# Patient Record
Sex: Male | Born: 1950
Health system: Southern US, Community
[De-identification: ages and names within clinical notes are randomized; demographics above are authoritative.]

## PROBLEM LIST (undated history)

## (undated) DIAGNOSIS — I10 Essential (primary) hypertension: Secondary | ICD-10-CM

## (undated) DIAGNOSIS — IMO0002 Reserved for concepts with insufficient information to code with codable children: Secondary | ICD-10-CM

## (undated) HISTORY — PX: BLADDER SURGERY: SHX569

---

## 2000-03-10 ENCOUNTER — Encounter: Payer: Self-pay | Admitting: Internal Medicine

## 2000-03-10 ENCOUNTER — Ambulatory Visit (HOSPITAL_COMMUNITY): Admission: RE | Admit: 2000-03-10 | Discharge: 2000-03-10 | Payer: Self-pay | Admitting: Internal Medicine

## 2000-03-17 ENCOUNTER — Encounter: Payer: Self-pay | Admitting: Internal Medicine

## 2000-03-17 ENCOUNTER — Ambulatory Visit (HOSPITAL_COMMUNITY): Admission: RE | Admit: 2000-03-17 | Discharge: 2000-03-17 | Payer: Self-pay | Admitting: Internal Medicine

## 2000-04-08 ENCOUNTER — Encounter: Payer: Self-pay | Admitting: Surgery

## 2000-04-08 ENCOUNTER — Observation Stay (HOSPITAL_COMMUNITY): Admission: RE | Admit: 2000-04-08 | Discharge: 2000-04-09 | Payer: Self-pay | Admitting: Surgery

## 2000-05-12 ENCOUNTER — Encounter: Payer: Self-pay | Admitting: Surgery

## 2000-05-12 ENCOUNTER — Ambulatory Visit (HOSPITAL_COMMUNITY): Admission: RE | Admit: 2000-05-12 | Discharge: 2000-05-12 | Payer: Self-pay | Admitting: Surgery

## 2000-06-22 ENCOUNTER — Ambulatory Visit (HOSPITAL_COMMUNITY): Admission: RE | Admit: 2000-06-22 | Discharge: 2000-06-22 | Payer: Self-pay | Admitting: Surgery

## 2000-06-22 ENCOUNTER — Encounter: Payer: Self-pay | Admitting: Surgery

## 2000-08-23 ENCOUNTER — Ambulatory Visit (HOSPITAL_COMMUNITY): Admission: RE | Admit: 2000-08-23 | Discharge: 2000-08-23 | Payer: Self-pay | Admitting: Surgery

## 2000-08-23 ENCOUNTER — Encounter: Payer: Self-pay | Admitting: Surgery

## 2012-04-22 ENCOUNTER — Encounter (HOSPITAL_COMMUNITY): Payer: Self-pay

## 2012-04-22 ENCOUNTER — Emergency Department (HOSPITAL_COMMUNITY): Payer: BC Managed Care – PPO

## 2012-04-22 ENCOUNTER — Emergency Department (HOSPITAL_COMMUNITY)
Admission: EM | Admit: 2012-04-22 | Discharge: 2012-04-22 | Disposition: A | Discharge status: Home / Self Care | Payer: BC Managed Care – PPO | Attending: Emergency Medicine | Admitting: Emergency Medicine

## 2012-04-22 DIAGNOSIS — K625 Hemorrhage of anus and rectum: Secondary | ICD-10-CM | POA: Insufficient documentation

## 2012-04-22 DIAGNOSIS — Z872 Personal history of diseases of the skin and subcutaneous tissue: Secondary | ICD-10-CM | POA: Insufficient documentation

## 2012-04-22 DIAGNOSIS — I880 Nonspecific mesenteric lymphadenitis: Secondary | ICD-10-CM

## 2012-04-22 DIAGNOSIS — R5383 Other fatigue: Secondary | ICD-10-CM | POA: Insufficient documentation

## 2012-04-22 DIAGNOSIS — R110 Nausea: Secondary | ICD-10-CM | POA: Insufficient documentation

## 2012-04-22 DIAGNOSIS — I10 Essential (primary) hypertension: Secondary | ICD-10-CM | POA: Insufficient documentation

## 2012-04-22 DIAGNOSIS — Z79899 Other long term (current) drug therapy: Secondary | ICD-10-CM | POA: Insufficient documentation

## 2012-04-22 DIAGNOSIS — R5381 Other malaise: Secondary | ICD-10-CM | POA: Insufficient documentation

## 2012-04-22 DIAGNOSIS — K921 Melena: Secondary | ICD-10-CM | POA: Insufficient documentation

## 2012-04-22 DIAGNOSIS — K922 Gastrointestinal hemorrhage, unspecified: Secondary | ICD-10-CM | POA: Insufficient documentation

## 2012-04-22 DIAGNOSIS — M549 Dorsalgia, unspecified: Secondary | ICD-10-CM | POA: Insufficient documentation

## 2012-04-22 DIAGNOSIS — J3489 Other specified disorders of nose and nasal sinuses: Secondary | ICD-10-CM | POA: Insufficient documentation

## 2012-04-22 DIAGNOSIS — R109 Unspecified abdominal pain: Secondary | ICD-10-CM

## 2012-04-22 HISTORY — DX: Reserved for concepts with insufficient information to code with codable children: IMO0002

## 2012-04-22 HISTORY — DX: Essential (primary) hypertension: I10

## 2012-04-22 LAB — CBC WITH DIFFERENTIAL/PLATELET
Eosinophils Absolute: 0.1 10*3/uL (ref 0.0–0.7)
Eosinophils Relative: 3 % (ref 0–5)
HCT: 36.5 % — ABNORMAL LOW (ref 39.0–52.0)
Lymphocytes Relative: 22 % (ref 12–46)
Lymphs Abs: 1.2 10*3/uL (ref 0.7–4.0)
MCH: 27.9 pg (ref 26.0–34.0)
MCV: 80.2 fL (ref 78.0–100.0)
Monocytes Absolute: 0.4 10*3/uL (ref 0.1–1.0)
Monocytes Relative: 8 % (ref 3–12)
Platelets: 154 10*3/uL (ref 150–400)
RBC: 4.55 MIL/uL (ref 4.22–5.81)
WBC: 5.4 10*3/uL (ref 4.0–10.5)

## 2012-04-22 LAB — BASIC METABOLIC PANEL
BUN: 17 mg/dL (ref 6–23)
CO2: 26 mEq/L (ref 19–32)
Calcium: 10.1 mg/dL (ref 8.4–10.5)
Creatinine, Ser: 1.26 mg/dL (ref 0.50–1.35)
GFR calc non Af Amer: 60 mL/min — ABNORMAL LOW (ref >90)
Glucose, Bld: 119 mg/dL — ABNORMAL HIGH (ref 70–99)

## 2012-04-22 LAB — HEPATIC FUNCTION PANEL
ALT: 21 U/L (ref 0–53)
Alkaline Phosphatase: 65 U/L (ref 39–117)
Indirect Bilirubin: 0.5 mg/dL (ref 0.3–0.9)
Total Bilirubin: 0.6 mg/dL (ref 0.3–1.2)
Total Protein: 7.7 g/dL (ref 6.0–8.3)

## 2012-04-22 MED ORDER — ONDANSETRON HCL 4 MG/2ML IJ SOLN
4.0000 mg | Freq: Once | INTRAMUSCULAR | Status: AC
  Administered 2012-04-22: 4 mg via INTRAVENOUS
  Filled 2012-04-22: qty 2

## 2012-04-22 MED ORDER — HYDROCODONE-ACETAMINOPHEN 5-325 MG PO TABS: 1.0000 | ORAL_TABLET | Freq: Four times a day (QID) | ORAL | Status: DC | PRN

## 2012-04-22 MED ORDER — SODIUM CHLORIDE 0.9 % IV SOLN
INTRAVENOUS | Status: DC
  Administered 2012-04-22: 12:00:00 via INTRAVENOUS

## 2012-04-22 MED ORDER — SODIUM CHLORIDE 0.9 % IV SOLN: INTRAVENOUS | Status: DC

## 2012-04-22 MED ORDER — SODIUM CHLORIDE 0.9 % IV BOLUS (SEPSIS)
250.0000 mL | Freq: Once | INTRAVENOUS | Status: AC
  Administered 2012-04-22: 250 mL via INTRAVENOUS

## 2012-04-22 MED ORDER — IOHEXOL 300 MG/ML  SOLN
100.0000 mL | Freq: Once | INTRAMUSCULAR | Status: AC | PRN
  Administered 2012-04-22: 100 mL via INTRAVENOUS

## 2012-04-22 MED ORDER — IOHEXOL 300 MG/ML  SOLN
50.0000 mL | Freq: Once | INTRAMUSCULAR | Status: AC | PRN
  Administered 2012-04-22: 50 mL via ORAL

## 2012-04-22 NOTE — ED Notes (Signed)
Pt reports abd pain this am and saw bright red blood when he wiped after having  A bm this morning.  Also reports nausea and some generalized weakness.

## 2012-04-22 NOTE — ED Provider Notes (Signed)
History    This chart was scribed for Sergio Jakes, MD by [name], ED Scribe. The patient was seen in room APA05/[facility]05. Patient's care was started at 9:59 AM.    CSN: [id number]  Arrival date & time 04/22/12  0930   First MD Initiated Contact with Patient 04/22/12 [phone]      Chief Complaint  Patient presents with  . Abdominal Pain  . GI Bleeding    (Consider location/radiation/quality/duration/timing/severity/associated sxs/prior treatment) The history is provided by the patient. No language interpreter was used.   Sergio Alvarez is a 62 y.o. male with h/o HTN who presents to the Emergency Department complaining of one episode of hematochezia onset this morning. Pt states he was having pain upon having a bowel movement with associated hematochezia. He states that he noticed blood when he wiped but could not clarify if there was blood in the toilet. Pt reports some generalized weakness for about a week. Pt also reports moderate intermittent superpubic abdominal "nagging" pain onset a month ago. Pt states that abdominal pain does not radiate. Abdominal pain lasts for about an hour and then goes away. Pt has had some associated nausea, headaches and back pain as well. He states he took some anti-acid with no immediate relief. An upper endoscopy was done on pt a month ago because of sx's by Dr. Teena Dunk. Procedure found that he had evidence of a stomach ulcer. Pt denies fever, chills, cough, vomiting, diarrhea, SOB, and any other associated symptoms. Dr. Olena Leatherwood is PCP.  Past Medical History  Diagnosis Date  . Hypertension   . Ulcer     Past Surgical History  Procedure Laterality Date  . Bladder surgery      No family history on file.  History  Substance Use Topics  . Smoking status: Never Smoker   . Smokeless tobacco: Not on file  . Alcohol Use: No      Review of Systems  Constitutional: Positive for fatigue. Negative for fever and chills.  HENT: Positive for  congestion.   Eyes: Negative for visual disturbance.  Respiratory: Negative for cough and shortness of breath.   Cardiovascular: Negative for chest pain.  Gastrointestinal: Positive for abdominal pain and blood in stool. Negative for nausea, vomiting and diarrhea.  Genitourinary: Negative for dysuria and hematuria.  Musculoskeletal: Positive for back pain. Negative for joint swelling.  Skin: Negative for rash.  Neurological: Positive for headaches.  Hematological: Does not bruise/bleed easily.  Psychiatric/Behavioral: Negative for confusion.    Allergies  Review of patient's allergies indicates no known allergies.  Home Medications   Current Outpatient Rx  Name  Route  Sig  Dispense  Refill  . amlodipine-olmesartan (AZOR) 10-20 MG per tablet   Oral   Take 1 tablet by mouth daily.         . chlorthalidone (HYGROTON) 25 MG tablet   Oral   Take 25 mg by mouth daily.         . Cholecalciferol (VITAMIN D) 2000 UNITS CAPS   Oral   Take 1 capsule by mouth daily.         . fish oil-omega-3 fatty acids 1000 MG capsule   Oral   Take 1 g by mouth daily.         Marland Kitchen omeprazole (PRILOSEC) 40 MG capsule   Oral   Take 40 mg by mouth daily.         Marland Kitchen HYDROcodone-acetaminophen (NORCO/VICODIN) 5-325 MG per tablet   Oral   Take 1-2  tablets by mouth every 6 (six) hours as needed for pain.   10 tablet   0     BP 161/71  Pulse 93  Temp(Src) 98.7 F (37.1 C) (Oral)  Resp 18  Ht 6\' 1"  (1.854 m)  Wt 217 lb (98.431 kg)  BMI 28.64 kg/m2  SpO2 95%  Physical Exam  Nursing note and vitals reviewed. Constitutional: He is oriented to person, place, and time. He appears well-developed and well-nourished. No distress.  HENT:  Head: Normocephalic and atraumatic.  Right Ear: External ear normal.  Left Ear: External ear normal.  Mouth/Throat: Oropharynx is clear and moist. No oropharyngeal exudate.  Eyes: Conjunctivae and EOM are normal. Pupils are equal, round, and reactive to  light. No scleral icterus.  Neck: Neck supple. No tracheal deviation present.  Cardiovascular: Normal rate, regular rhythm and normal heart sounds.   No murmur heard. Pulmonary/Chest: Effort normal and breath sounds normal. No respiratory distress. He has no rales.  Abdominal: Soft. Bowel sounds are normal. There is no tenderness. There is no guarding.  No mass above the umbilicus   Musculoskeletal: Normal range of motion.  Lymphadenopathy:    He has no cervical adenopathy.  Neurological: He is alert and oriented to person, place, and time. No cranial nerve deficit. He exhibits normal muscle tone. Coordination normal.  Skin: Skin is warm and dry.  Psychiatric: He has a normal mood and affect. His behavior is normal.    ED Course  Procedures (including critical care time) DIAGNOSTIC STUDIES: Oxygen Saturation is 95% on room air, adequate by my interpretation.    COORDINATION OF CARE: 10:52 AM Discussed ED treatment with pt and pt agrees.     Labs Reviewed  CBC WITH DIFFERENTIAL - Abnormal; Notable for the following:    Hemoglobin 12.7 (*)    HCT 36.5 (*)    All other components within normal limits  BASIC METABOLIC PANEL - Abnormal; Notable for the following:    Glucose, Bld 119 (*)    GFR calc non Af Amer 60 (*)    GFR calc Af Amer 69 (*)    All other components within normal limits  LIPASE, BLOOD  HEPATIC FUNCTION PANEL   Ct Abdomen Pelvis W Contrast  04/22/2012  *RADIOLOGY REPORT*  Clinical Data: GI bleed, abdominal pain.  CT ABDOMEN AND PELVIS WITH CONTRAST  Technique:  Multidetector CT imaging of the abdomen and pelvis was performed following the standard protocol during bolus administration of intravenous contrast.  Contrast: 50mL OMNIPAQUE IOHEXOL 300 MG/ML  SOLN, OMNIPAQUE IOHEXOL 300 MG/ML  SOLN  Comparison: 08/23/2000 by report only  Findings: Visualized lung bases clear.  Vascular clips in the gallbladder fossa.  Unremarkable liver, spleen, adrenal glands,  pancreas.  2.5 cm fluid attenuation lesion in the lower pole right kidney probably cyst.  No hydronephrosis.  There are hazy infiltrative/inflammatory changes centrally in the mesentery, with a fairly well-defined peripheral margin.  There are regional enlarged central mesenteric lymph nodes up to 10 mm diameter.  Similar changes were described on the previous study. No pelvic or retroperitoneal adenopathy.  No ascites.  No free air. Stomach physiologically distended.  Small bowel and colon nondilated.  Urinary bladder incompletely distended, mildly thick- walled.  Mild prostatic enlargement.  Lumbar spine intact.  IMPRESSION:  1. No acute abdominal process. 2.   Central mesenteric   changes and adenopathy as described previously suggesting chronic mesenteric adenitis.   Original Report Authenticated By: D. Andria Rhein, MD  Date: 04/22/2012  Rate: 93  Rhythm: normal sinus rhythm  QRS Axis: left  Intervals: normal  ST/T Wave abnormalities: normal  Conduction Disutrbances:none  Narrative Interpretation:   Old EKG Reviewed: unchanged EKG without significant changes compared to 04/08/2000  Results for orders placed during the hospital encounter of 04/22/12  CBC WITH DIFFERENTIAL      Result Value Range   WBC 5.4  4.0 - 10.5 K/uL   RBC 4.55  4.22 - 5.81 MIL/uL   Hemoglobin 12.7 (*) 13.0 - 17.0 g/dL   HCT 62.9 (*) 52.8 - 41.3 %   MCV 80.2  78.0 - 100.0 fL   MCH 27.9  26.0 - 34.0 pg   MCHC 34.8  30.0 - 36.0 g/dL   RDW 24.4  01.0 - 27.2 %   Platelets 154  150 - 400 K/uL   Neutrophils Relative 67  43 - 77 %   Neutro Abs 3.6  1.7 - 7.7 K/uL   Lymphocytes Relative 22  12 - 46 %   Lymphs Abs 1.2  0.7 - 4.0 K/uL   Monocytes Relative 8  3 - 12 %   Monocytes Absolute 0.4  0.1 - 1.0 K/uL   Eosinophils Relative 3  0 - 5 %   Eosinophils Absolute 0.1  0.0 - 0.7 K/uL   Basophils Relative 1  0 - 1 %   Basophils Absolute 0.0  0.0 - 0.1 K/uL  BASIC METABOLIC PANEL      Result Value Range   Sodium  138  135 - 145 mEq/L   Potassium 4.0  3.5 - 5.1 mEq/L   Chloride 101  96 - 112 mEq/L   CO2 26  19 - 32 mEq/L   Glucose, Bld 119 (*) 70 - 99 mg/dL   BUN 17  6 - 23 mg/dL   Creatinine, Ser 5.36  0.50 - 1.35 mg/dL   Calcium 64.4  8.4 - 03.4 mg/dL   GFR calc non Af Amer 60 (*) >90 mL/min   GFR calc Af Amer 69 (*) >90 mL/min  LIPASE, BLOOD      Result Value Range   Lipase 26  11 - 59 U/L  HEPATIC FUNCTION PANEL      Result Value Range   Total Protein 7.7  6.0 - 8.3 g/dL   Albumin 4.3  3.5 - 5.2 g/dL   AST 23  0 - 37 U/L   ALT 21  0 - 53 U/L   Alkaline Phosphatase 65  39 - 117 U/L   Total Bilirubin 0.6  0.3 - 1.2 mg/dL   Bilirubin, Direct 0.1  0.0 - 0.3 mg/dL   Indirect Bilirubin 0.5  0.3 - 0.9 mg/dL     1. Abdominal pain   2. Mesenteric adenitis   3. Rectal bleeding       MDM  CT scan consistent with chronic mesenteric adenitis is been there before it may explain abdominal pain. Patient without any evidence for the rectal bleeding. Either based on labs or exam hemoglobin and hematocrit is normal. No significant leukocytosis. Platelet count is normal. Recommended patient have a colonoscopy through his GI doctor that did the upper endoscopy to follow back up with his primary care Dr. Patient will need to return for any further rectal bleeding. Patient had one episode of rectal bleeding today which was just blood on the tall paper. Hemodynamically stable no evidence of significant blood loss. No acute abdominal process.       I personally performed the  services described in this documentation, which was scribed in my presence. The recorded information has been reviewed and is accurate.          Sergio Jakes, MD 04/22/12 1239

## 2016-04-27 DIAGNOSIS — Z87891 Personal history of nicotine dependence: Secondary | ICD-10-CM | POA: Diagnosis not present

## 2016-04-27 DIAGNOSIS — J40 Bronchitis, not specified as acute or chronic: Secondary | ICD-10-CM | POA: Diagnosis not present

## 2016-04-27 DIAGNOSIS — I10 Essential (primary) hypertension: Secondary | ICD-10-CM | POA: Diagnosis not present

## 2016-05-03 DIAGNOSIS — Z6832 Body mass index (BMI) 32.0-32.9, adult: Secondary | ICD-10-CM | POA: Diagnosis not present

## 2016-05-03 DIAGNOSIS — K920 Hematemesis: Secondary | ICD-10-CM | POA: Diagnosis not present

## 2016-05-03 DIAGNOSIS — E784 Other hyperlipidemia: Secondary | ICD-10-CM | POA: Diagnosis not present

## 2016-05-03 DIAGNOSIS — M545 Low back pain: Secondary | ICD-10-CM | POA: Diagnosis not present

## 2016-05-03 DIAGNOSIS — I10 Essential (primary) hypertension: Secondary | ICD-10-CM | POA: Diagnosis not present

## 2016-07-25 ENCOUNTER — Emergency Department (HOSPITAL_COMMUNITY)
Admission: EM | Admit: 2016-07-25 | Discharge: 2016-07-26 | Disposition: A | Discharge status: Home / Self Care | Payer: Medicare Other | Attending: Emergency Medicine | Admitting: Emergency Medicine

## 2016-07-25 ENCOUNTER — Encounter (HOSPITAL_COMMUNITY): Payer: Self-pay | Admitting: *Deleted

## 2016-07-25 DIAGNOSIS — M62838 Other muscle spasm: Secondary | ICD-10-CM | POA: Diagnosis not present

## 2016-07-25 DIAGNOSIS — M791 Myalgia, unspecified site: Secondary | ICD-10-CM

## 2016-07-25 DIAGNOSIS — R0602 Shortness of breath: Secondary | ICD-10-CM | POA: Insufficient documentation

## 2016-07-25 DIAGNOSIS — Z79899 Other long term (current) drug therapy: Secondary | ICD-10-CM | POA: Diagnosis not present

## 2016-07-25 DIAGNOSIS — R252 Cramp and spasm: Secondary | ICD-10-CM | POA: Diagnosis not present

## 2016-07-25 DIAGNOSIS — R0600 Dyspnea, unspecified: Secondary | ICD-10-CM | POA: Diagnosis not present

## 2016-07-25 DIAGNOSIS — I10 Essential (primary) hypertension: Secondary | ICD-10-CM | POA: Insufficient documentation

## 2016-07-25 DIAGNOSIS — N281 Cyst of kidney, acquired: Secondary | ICD-10-CM | POA: Insufficient documentation

## 2016-07-25 NOTE — ED Triage Notes (Signed)
Pt c/o cramping to both arms and slight headache; pt also states he has had some intermittent sob

## 2016-07-26 ENCOUNTER — Emergency Department (HOSPITAL_COMMUNITY): Payer: Medicare Other

## 2016-07-26 DIAGNOSIS — R0602 Shortness of breath: Secondary | ICD-10-CM | POA: Diagnosis not present

## 2016-07-26 DIAGNOSIS — R0600 Dyspnea, unspecified: Secondary | ICD-10-CM | POA: Diagnosis not present

## 2016-07-26 LAB — CBC WITH DIFFERENTIAL/PLATELET
BASOS ABS: 0 10*3/uL (ref 0.0–0.1)
BASOS PCT: 1 %
Eosinophils Absolute: 0.1 10*3/uL (ref 0.0–0.7)
Eosinophils Relative: 2 %
HEMATOCRIT: 36.3 % — AB (ref 39.0–52.0)
HEMOGLOBIN: 12.5 g/dL — AB (ref 13.0–17.0)
Lymphocytes Relative: 22 %
Lymphs Abs: 1.4 10*3/uL (ref 0.7–4.0)
MCH: 27 pg (ref 26.0–34.0)
MCHC: 34.4 g/dL (ref 30.0–36.0)
MCV: 78.4 fL (ref 78.0–100.0)
MONOS PCT: 10 %
Monocytes Absolute: 0.6 10*3/uL (ref 0.1–1.0)
NEUTROS ABS: 4.3 10*3/uL (ref 1.7–7.7)
NEUTROS PCT: 65 %
Platelets: 183 10*3/uL (ref 150–400)
RBC: 4.63 MIL/uL (ref 4.22–5.81)
RDW: 14.5 % (ref 11.5–15.5)
WBC: 6.4 10*3/uL (ref 4.0–10.5)

## 2016-07-26 LAB — COMPREHENSIVE METABOLIC PANEL
ALBUMIN: 4.4 g/dL (ref 3.5–5.0)
ALK PHOS: 63 U/L (ref 38–126)
ALT: 24 U/L (ref 17–63)
ANION GAP: 12 (ref 5–15)
AST: 21 U/L (ref 15–41)
BILIRUBIN TOTAL: 0.5 mg/dL (ref 0.3–1.2)
BUN: 23 mg/dL — AB (ref 6–20)
CALCIUM: 9.9 mg/dL (ref 8.9–10.3)
CO2: 28 mmol/L (ref 22–32)
Chloride: 101 mmol/L (ref 101–111)
Creatinine, Ser: 1.44 mg/dL — ABNORMAL HIGH (ref 0.61–1.24)
GFR calc Af Amer: 57 mL/min — ABNORMAL LOW (ref >60)
GFR calc non Af Amer: 50 mL/min — ABNORMAL LOW (ref >60)
GLUCOSE: 138 mg/dL — AB (ref 65–99)
POTASSIUM: 3.1 mmol/L — AB (ref 3.5–5.1)
SODIUM: 141 mmol/L (ref 135–145)
Total Protein: 7.8 g/dL (ref 6.5–8.1)

## 2016-07-26 LAB — TROPONIN I: Troponin I: <0.03 ng/mL (ref <0.03)

## 2016-07-26 LAB — CK: Total CK: 183 U/L (ref 49–397)

## 2016-07-26 MED ORDER — LORAZEPAM 2 MG/ML IJ SOLN
0.5000 mg | Freq: Once | INTRAMUSCULAR | Status: AC
  Administered 2016-07-26: 0.5 mg via INTRAVENOUS

## 2016-07-26 MED ORDER — IOPAMIDOL (ISOVUE-370) INJECTION 76%
100.0000 mL | Freq: Once | INTRAVENOUS | Status: AC | PRN
  Administered 2016-07-26: 100 mL via INTRAVENOUS

## 2016-07-26 MED ORDER — SODIUM CHLORIDE 0.9 % IV BOLUS (SEPSIS)
500.0000 mL | Freq: Once | INTRAVENOUS | Status: AC
  Administered 2016-07-26: 500 mL via INTRAVENOUS

## 2016-07-26 MED ORDER — METHOCARBAMOL 500 MG PO TABS: 500.0000 mg | ORAL_TABLET | Freq: Three times a day (TID) | ORAL | 0 refills | Status: DC | PRN

## 2016-07-26 MED ORDER — LORAZEPAM 2 MG/ML IJ SOLN
INTRAMUSCULAR | Status: AC
  Filled 2016-07-26: qty 1

## 2016-07-26 NOTE — ED Notes (Signed)
Pt sleeping at this time.  Wife at bedside.  

## 2016-07-26 NOTE — ED Provider Notes (Signed)
[facility] DEPT Provider Note   CSN: [id number] Arrival date & time: 07/25/16  2322     History   Chief Complaint Chief Complaint  Patient presents with  . Generalized Body Aches    HPI Sergio Alvarez is a 66 y.o. male.  Patient presents with complaints of pain in his shoulders and arms. This has been ongoing for 2 days. He reports that he has been having an aching pain that will come in one arm or the other in the last 4 and then fade away. He has not identified any causal factors. He has not taken any medication for this. It has been on both sides, but randomly will occur. He has had some associated lower back pain. He also reports a mild headache.      Past Medical History:  Diagnosis Date  . Hypertension   . Ulcer     There are no active problems to display for this patient.   Past Surgical History:  Procedure Laterality Date  . BLADDER SURGERY         Home Medications    Prior to Admission medications   Medication Sig Start Date End Date Taking? Authorizing Provider  amlodipine-olmesartan (AZOR) 10-20 MG per tablet Take 1 tablet by mouth daily.    [provider]  chlorthalidone (HYGROTON) 25 MG tablet Take 25 mg by mouth daily.    [provider]  Cholecalciferol (VITAMIN D) 2000 UNITS CAPS Take 1 capsule by mouth daily.    [provider]  fish oil-omega-3 fatty acids 1000 MG capsule Take 1 g by mouth daily.    [provider]    Family History History reviewed. No pertinent family history.  Social History Social History  Substance Use Topics  . Smoking status: Never Smoker  . Smokeless tobacco: Never Used  . Alcohol use No     Allergies   Patient has no known allergies.   Review of Systems Review of Systems  Cardiovascular: Negative for chest pain.  Musculoskeletal: Positive for arthralgias and myalgias.  Skin: Negative for rash.  Neurological: Positive for headaches.  All other systems reviewed  and are negative.    Physical Exam Updated Vital Signs BP (!) 169/80 (BP Location: Left Arm)   Pulse 98   Temp 98.9 F (37.2 C) (Oral)   Resp 20   Ht 6' (1.829 m)   Wt 106.6 kg (235 lb)   SpO2 98%   BMI 31.87 kg/m   Physical Exam  Constitutional: He is oriented to person, place, and time. He appears well-developed and well-nourished. No distress.  HENT:  Head: Normocephalic and atraumatic.  Right Ear: Hearing normal.  Left Ear: Hearing normal.  Nose: Nose normal.  Mouth/Throat: Oropharynx is clear and moist and mucous membranes are normal.  Eyes: Conjunctivae and EOM are normal. Pupils are equal, round, and reactive to light.  Neck: Normal range of motion. Neck supple.  Cardiovascular: Regular rhythm, S1 normal and S2 normal.  Exam reveals no gallop and no friction rub.   No murmur heard. Pulmonary/Chest: Effort normal and breath sounds normal. No respiratory distress. He exhibits no tenderness.  Abdominal: Soft. Normal appearance and bowel sounds are normal. There is no hepatosplenomegaly. There is no tenderness. There is no rebound, no guarding, no tenderness at McBurney's point and negative Murphy's sign. No hernia.  Musculoskeletal: Normal range of motion.  Neurological: He is alert and oriented to person, place, and time. He has normal strength. No cranial nerve deficit or sensory deficit.  Coordination normal. GCS eye subscore is 4. GCS verbal subscore is 5. GCS motor subscore is 6.  Skin: Skin is warm, dry and intact. No rash noted. No cyanosis.  Psychiatric: He has a normal mood and affect. His speech is normal and behavior is normal. Thought content normal.  Nursing note and vitals reviewed.    ED Treatments / Results  Labs (all labs ordered are listed, but only abnormal results are displayed) Labs Reviewed  CBC WITH DIFFERENTIAL/PLATELET - Abnormal; Notable for the following:       Result Value   Hemoglobin 12.5 (*)    HCT 36.3 (*)    All other components  within normal limits  COMPREHENSIVE METABOLIC PANEL - Abnormal; Notable for the following:    Potassium 3.1 (*)    Glucose, Bld 138 (*)    BUN 23 (*)    Creatinine, Ser 1.44 (*)    GFR calc non Af Amer 50 (*)    GFR calc Af Amer 57 (*)    All other components within normal limits  TROPONIN I  TROPONIN I  CK    EKG  EKG Interpretation  Date/Time:  Sunday July 25 2016 23:48:00 EDT Ventricular Rate:  96 PR Interval:  170 QRS Duration: 90 QT Interval:  350 QTC Calculation: 442 R Axis:   -46 Text Interpretation:  Normal sinus rhythm Left anterior fascicular block Cannot rule out Inferior infarct (masked by fascicular block?) , age undetermined Abnormal ECG No significant change since last tracing Confirmed by Orpah Greek [phone]) on 07/26/2016 1:00:41 AM       Radiology Dg Chest 2 View  Result Date: 07/26/2016 CLINICAL DATA:  Shortness of breath. Arm cramping. History of hypertension. EXAM: CHEST  2 VIEW COMPARISON:  Chest radiograph April 27, 2016 FINDINGS: Cardiomediastinal silhouette is normal. No pleural effusions or focal consolidations. Azygos fissure . Trachea projects midline and there is no pneumothorax. Soft tissue planes and included osseous structures are non-suspicious. Surgical clips in the included right abdomen compatible with cholecystectomy. IMPRESSION: Stable examination:  No acute cardiopulmonary process. Electronically Signed   By: Elon Alas M.D.   On: 07/26/2016 01:40   Ct Angio Chest/abd/pel For Dissection W And/or Wo Contrast  Result Date: 07/26/2016 CLINICAL DATA:  Upper extremity cramping and intermittent dyspnea today. EXAM: CT ANGIOGRAPHY CHEST, ABDOMEN AND PELVIS TECHNIQUE: Multidetector CT imaging through the chest, abdomen and pelvis was performed using the standard protocol during bolus administration of intravenous contrast. Multiplanar reconstructed images and MIPs were obtained and reviewed to evaluate the vascular anatomy. CONTRAST:   100 mL Isovue 370 intravenous COMPARISON:  04/22/2012 FINDINGS: CTA CHEST FINDINGS Cardiovascular: Preferential opacification of the thoracic aorta. No evidence of thoracic aortic aneurysm or dissection. Normal heart size. No pericardial effusion. Mediastinum/Nodes: No enlarged mediastinal, hilar, or axillary lymph nodes. Thyroid gland, trachea, and esophagus demonstrate no significant findings. Lungs/Pleura: Lungs are clear. No pleural effusion or pneumothorax. Musculoskeletal: Remote healed sternal fracture. No significant skeletal lesion. Review of the MIP images confirms the above findings. CTA ABDOMEN AND PELVIS FINDINGS VASCULAR Aorta: Normal caliber aorta. Moderate atherosclerotic calcification in the distal aorta. Celiac: Patent without evidence of aneurysm, dissection, vasculitis or significant stenosis. SMA: Patent without evidence of aneurysm, dissection, vasculitis or significant stenosis. Renals: Both renal arteries are patent without evidence of aneurysm, dissection, vasculitis, fibromuscular dysplasia or significant stenosis. IMA: Patent without evidence of aneurysm, dissection, vasculitis or significant stenosis. Inflow: Patent without evidence of aneurysm, dissection, vasculitis or significant stenosis. Veins: No obvious venous  abnormality within the limitations of this arterial phase study. Review of the MIP images confirms the above findings. NON-VASCULAR Hepatobiliary: Cholecystectomy. Mild hepatic steatosis. No focal liver lesion. Normal bile ducts. Pancreas: Unremarkable. No pancreatic ductal dilatation or surrounding inflammatory changes. Spleen: Normal in size without focal abnormality. Adrenals/Urinary Tract: Both adrenals are normal. 3.2 cm cyst of the lateral right lower pole. 1.2 cm low-attenuation lesion at the posterior right lower pole, probably also a cyst. Both are slightly larger compared to 04/22/2012. No suspicious focal renal lesion. Collecting systems, ureters and urinary  bladder are unremarkable. Stomach/Bowel: Stomach is within normal limits. Appendix is normal. No evidence of bowel wall thickening, distention, or inflammatory changes. Lymphatic: Hazy opacities in the central mesentery with mildly prominent central mesenteric lymph nodes. This is unchanged from 2014. Reproductive: Unremarkable. Other: No focal inflammation.  No ascites. Musculoskeletal: No significant skeletal lesion. Lower lumbar facet arthritis. Review of the MIP images confirms the above findings. IMPRESSION: 1. Aortic atherosclerosis.  No aneurysm, stenosis or occlusion. 2. No acute findings are evident in the abdomen or pelvis. 3. Stable mesenteric nodes without change from 04/22/2012. 4. Mild hepatic steatosis. 5. Lower pole right renal cysts, slightly enlarged from 2014. No suspicious renal lesions. Electronically Signed   By: Andreas Newport M.D.   On: 07/26/2016 03:24    Procedures Procedures (including critical care time)  Medications Ordered in ED Medications  sodium chloride 0.9 % bolus 500 mL (0 mLs Intravenous Stopped 07/26/16 0255)  iopamidol (ISOVUE-370) 76 % injection 100 mL (100 mLs Intravenous Contrast Given 07/26/16 0242)  LORazepam (ATIVAN) injection 0.5 mg (0.5 mg Intravenous Given 07/26/16 0218)     Initial Impression / Assessment and Plan / ED Course  I have reviewed the triage vital signs and the nursing notes.  Pertinent labs & imaging results that were available during my care of the patient were reviewed by me and considered in my medical decision making (see chart for details).     Patient presents to the emergency department with complaints of aches and pains. He was experiencing intermittent episodes of aching pain in both of his shoulders and back. He also had some lower back pain. Symptoms were very nonspecific. He is not experiencing any anterior chest pain. EKG at arrival was normal. He had a normal troponin. This was repeated and was still undetectable. No  concern for acute cardiac syndrome or cardiac etiology of the bilateral shoulder pain. Because he was having pain in the shoulder area as well as back, aortic abnormalities were considered. CT angiography of chest, abdomen and pelvis showed no pathology.  While here in the ER he started to have some intermittent episodes of feeling weak and experiencing hot flushes. While this was occurring he was having fasciculations and spasms of the muscles in his legs. This is likely was causing the pains in his upper arms as well. Patient treated with some IV hydration. He has not had a fever. A CPK was checked, no evidence of rhabdomyolysis. Kidney function is GFR 57 with a creatinine of 1.44.  At this point, patient's symptoms appear to be secondary to spasms of proximal muscles. I do not feel the patient requires any further workup at this time. He will be encouraged to continue oral hydration, given muscle relaxers and is to follow-up with his primary care doctor.  Final Clinical Impressions(s) / ED Diagnoses   Final diagnoses:  Myalgia  Muscle spasm    New Prescriptions New Prescriptions   No medications on file  Orpah Greek, MD 07/26/16 [phone]

## 2016-07-26 NOTE — ED Notes (Signed)
Pt reports having episodes of "feeling hot"

## 2016-07-29 ENCOUNTER — Emergency Department (HOSPITAL_COMMUNITY)
Admission: EM | Admit: 2016-07-29 | Discharge: 2016-07-30 | Disposition: A | Discharge status: Home / Self Care | Payer: Medicare Other | Attending: Emergency Medicine | Admitting: Emergency Medicine

## 2016-07-29 ENCOUNTER — Encounter (HOSPITAL_COMMUNITY): Payer: Self-pay

## 2016-07-29 DIAGNOSIS — K921 Melena: Secondary | ICD-10-CM | POA: Insufficient documentation

## 2016-07-29 DIAGNOSIS — R531 Weakness: Secondary | ICD-10-CM | POA: Diagnosis not present

## 2016-07-29 DIAGNOSIS — Z87891 Personal history of nicotine dependence: Secondary | ICD-10-CM | POA: Diagnosis not present

## 2016-07-29 DIAGNOSIS — I10 Essential (primary) hypertension: Secondary | ICD-10-CM | POA: Insufficient documentation

## 2016-07-29 DIAGNOSIS — R232 Flushing: Secondary | ICD-10-CM

## 2016-07-29 DIAGNOSIS — R110 Nausea: Secondary | ICD-10-CM | POA: Insufficient documentation

## 2016-07-29 DIAGNOSIS — R1033 Periumbilical pain: Secondary | ICD-10-CM | POA: Insufficient documentation

## 2016-07-29 DIAGNOSIS — R5383 Other fatigue: Secondary | ICD-10-CM | POA: Diagnosis not present

## 2016-07-29 DIAGNOSIS — R55 Syncope and collapse: Secondary | ICD-10-CM | POA: Diagnosis not present

## 2016-07-29 LAB — CBC
HCT: 38 % — ABNORMAL LOW (ref 39.0–52.0)
HEMOGLOBIN: 13.4 g/dL (ref 13.0–17.0)
MCH: 27.7 pg (ref 26.0–34.0)
MCHC: 35.3 g/dL (ref 30.0–36.0)
MCV: 78.5 fL (ref 78.0–100.0)
Platelets: 155 10*3/uL (ref 150–400)
RBC: 4.84 MIL/uL (ref 4.22–5.81)
RDW: 14.5 % (ref 11.5–15.5)
WBC: 7.2 10*3/uL (ref 4.0–10.5)

## 2016-07-29 LAB — BASIC METABOLIC PANEL
ANION GAP: 14 (ref 5–15)
BUN: 24 mg/dL — ABNORMAL HIGH (ref 6–20)
CHLORIDE: 97 mmol/L — AB (ref 101–111)
CO2: 24 mmol/L (ref 22–32)
Calcium: 9.9 mg/dL (ref 8.9–10.3)
Creatinine, Ser: 1.51 mg/dL — ABNORMAL HIGH (ref 0.61–1.24)
GFR calc non Af Amer: 47 mL/min — ABNORMAL LOW (ref >60)
GFR, EST AFRICAN AMERICAN: 54 mL/min — AB (ref >60)
Glucose, Bld: 116 mg/dL — ABNORMAL HIGH (ref 65–99)
Potassium: 3 mmol/L — ABNORMAL LOW (ref 3.5–5.1)
Sodium: 135 mmol/L (ref 135–145)

## 2016-07-29 NOTE — ED Triage Notes (Addendum)
Patient states that he is feeling weak, having hot spells at times.  Feels like he is going to pass out at times.  May have some blood in his stool. Patient ambulated to triage office from waiting room without any difficulties.  States that his legs feel weak.

## 2016-07-30 ENCOUNTER — Emergency Department (HOSPITAL_COMMUNITY): Payer: Medicare Other

## 2016-07-30 DIAGNOSIS — G603 Idiopathic progressive neuropathy: Secondary | ICD-10-CM | POA: Diagnosis not present

## 2016-07-30 DIAGNOSIS — I10 Essential (primary) hypertension: Secondary | ICD-10-CM | POA: Diagnosis not present

## 2016-07-30 DIAGNOSIS — R1033 Periumbilical pain: Secondary | ICD-10-CM | POA: Diagnosis not present

## 2016-07-30 DIAGNOSIS — E784 Other hyperlipidemia: Secondary | ICD-10-CM | POA: Diagnosis not present

## 2016-07-30 DIAGNOSIS — M545 Low back pain: Secondary | ICD-10-CM | POA: Diagnosis not present

## 2016-07-30 LAB — URINALYSIS, ROUTINE W REFLEX MICROSCOPIC
Bilirubin Urine: NEGATIVE
GLUCOSE, UA: NEGATIVE mg/dL
HGB URINE DIPSTICK: NEGATIVE
KETONES UR: NEGATIVE mg/dL
Leukocytes, UA: NEGATIVE
Nitrite: NEGATIVE
PH: 5 (ref 5.0–8.0)
Protein, ur: NEGATIVE mg/dL
Specific Gravity, Urine: 1.012 (ref 1.005–1.030)

## 2016-07-30 LAB — TROPONIN I
Troponin I: <0.03 ng/mL (ref <0.03)
Troponin I: <0.03 ng/mL (ref <0.03)

## 2016-07-30 LAB — HEPATIC FUNCTION PANEL
ALBUMIN: 4.4 g/dL (ref 3.5–5.0)
ALK PHOS: 61 U/L (ref 38–126)
ALT: 27 U/L (ref 17–63)
AST: 22 U/L (ref 15–41)
BILIRUBIN DIRECT: 0.2 mg/dL (ref 0.1–0.5)
BILIRUBIN TOTAL: 1 mg/dL (ref 0.3–1.2)
Indirect Bilirubin: 0.8 mg/dL (ref 0.3–0.9)
Total Protein: 7.5 g/dL (ref 6.5–8.1)

## 2016-07-30 LAB — LIPASE, BLOOD: LIPASE: 23 U/L (ref 11–51)

## 2016-07-30 LAB — CK: CK TOTAL: 230 U/L (ref 49–397)

## 2016-07-30 MED ORDER — ONDANSETRON HCL 4 MG/2ML IJ SOLN
4.0000 mg | Freq: Once | INTRAMUSCULAR | Status: AC
  Administered 2016-07-30: 4 mg via INTRAVENOUS
  Filled 2016-07-30: qty 2

## 2016-07-30 MED ORDER — IOPAMIDOL (ISOVUE-300) INJECTION 61%
100.0000 mL | Freq: Once | INTRAVENOUS | Status: AC | PRN
  Administered 2016-07-30: 100 mL via INTRAVENOUS

## 2016-07-30 MED ORDER — SODIUM CHLORIDE 0.9 % IV BOLUS (SEPSIS)
1000.0000 mL | Freq: Once | INTRAVENOUS | Status: AC
  Administered 2016-07-30: 1000 mL via INTRAVENOUS

## 2016-07-30 MED ORDER — POTASSIUM CHLORIDE CRYS ER 20 MEQ PO TBCR
40.0000 meq | EXTENDED_RELEASE_TABLET | Freq: Once | ORAL | Status: AC
  Administered 2016-07-30: 40 meq via ORAL
  Filled 2016-07-30: qty 2

## 2016-07-30 NOTE — ED Notes (Signed)
Occult Blood: Negative

## 2016-07-30 NOTE — ED Provider Notes (Signed)
[facility] DEPT Provider Note   CSN: [id number] Arrival date & time: 07/29/16  2205     History   Chief Complaint Chief Complaint  Patient presents with  . Weakness    HPI Sergio Alvarez is a 66 y.o. male.  Patient presenting with chills, generalized weakness, hot flashes and feeling like he is going to pass out that comes and goes. Reports started feeling bad this evening and feels sensation of warmth spreading over his body and feeling like he might pass out. He has some lower abdominal pain and thinks she saw some blood in his stool earlier today but isn't sure. No vomiting. No fever. No chest pain or shortness of breath. No focal weakness, numbness or tingling. No difficulty speaking or swallowing. No sick contacts or recent travel. Did eat dinner normally and started feeling poorly shortly after. Does not take any blood thinners. Denies any alcohol or drug use.   The history is provided by the patient.  Weakness  Primary symptoms include dizziness. Pertinent negatives include no shortness of breath and no vomiting.    Past Medical History:  Diagnosis Date  . Hypertension   . Ulcer     There are no active problems to display for this patient.   Past Surgical History:  Procedure Laterality Date  . BLADDER SURGERY         Home Medications    Prior to Admission medications   Medication Sig Start Date End Date Taking? Authorizing Provider  amlodipine-olmesartan (AZOR) 10-20 MG per tablet Take 1 tablet by mouth daily.    [provider]  chlorthalidone (HYGROTON) 25 MG tablet Take 25 mg by mouth daily.    [provider]  Cholecalciferol (VITAMIN D) 2000 UNITS CAPS Take 1 capsule by mouth daily.    [provider]  fish oil-omega-3 fatty acids 1000 MG capsule Take 1 g by mouth daily.    [provider]  methocarbamol (ROBAXIN) 500 MG tablet Take 1 tablet (500 mg total) by mouth every 8 (eight) hours as needed for muscle spasms.  07/26/16   Orpah Greek, MD    Family History History reviewed. No pertinent family history.  Social History Social History  Substance Use Topics  . Smoking status: Former Research scientist (life sciences)  . Smokeless tobacco: Never Used  . Alcohol use No     Allergies   Patient has no known allergies.   Review of Systems Review of Systems  Constitutional: Positive for activity change, appetite change, chills and fatigue. Negative for fever.  HENT: Negative for congestion and rhinorrhea.   Respiratory: Negative for cough, chest tightness and shortness of breath.   Gastrointestinal: Positive for abdominal pain, blood in stool and nausea. Negative for vomiting.  Genitourinary: Negative for dysuria, hematuria and testicular pain.  Musculoskeletal: Negative for arthralgias, back pain, myalgias and neck pain.  Neurological: Positive for dizziness, weakness and light-headedness.   all other systems are negative except as noted in the HPI and PMH.    Physical Exam Updated Vital Signs BP (!) 138/34 (BP Location: Right Arm)   Pulse (!) 102   Temp 97.6 F (36.4 C) (Oral)   Resp 18   Ht 6' (1.829 m)   Wt 106.6 kg (235 lb)   SpO2 99%   BMI 31.87 kg/m   Physical Exam  Constitutional: He is oriented to person, place, and time. He appears well-developed and well-nourished. No distress.  HENT:  Head: Normocephalic and atraumatic.  Mouth/Throat: Oropharynx is clear and moist. No  oropharyngeal exudate.  Eyes: Conjunctivae and EOM are normal. Pupils are equal, round, and reactive to light.  Neck: Normal range of motion. Neck supple.  No meningismus.  Cardiovascular: Normal rate, regular rhythm, normal heart sounds and intact distal pulses.   No murmur heard. Pulmonary/Chest: Effort normal and breath sounds normal. No respiratory distress.  Abdominal: Soft. There is tenderness. There is no rebound and no guarding.  Periumbilical tenderness with guarding  Genitourinary:  Genitourinary Comments:  No hemorrhoids, no fissures, no gross blood  Musculoskeletal: Normal range of motion. He exhibits no edema or tenderness.  Neurological: He is alert and oriented to person, place, and time. No cranial nerve deficit. He exhibits normal muscle tone. Coordination normal.  No ataxia on finger to nose bilaterally. No pronator drift. 5/5 strength throughout. CN 2-12 intact.Equal grip strength. Sensation intact.   Skin: Skin is warm.  Psychiatric: He has a normal mood and affect. His behavior is normal.  Nursing note and vitals reviewed.    ED Treatments / Results  Labs (all labs ordered are listed, but only abnormal results are displayed) Labs Reviewed  BASIC METABOLIC PANEL - Abnormal; Notable for the following:       Result Value   Potassium 3.0 (*)    Chloride 97 (*)    Glucose, Bld 116 (*)    BUN 24 (*)    Creatinine, Ser 1.51 (*)    GFR calc non Af Amer 47 (*)    GFR calc Af Amer 54 (*)    All other components within normal limits  CBC - Abnormal; Notable for the following:    HCT 38.0 (*)    All other components within normal limits  URINALYSIS, ROUTINE W REFLEX MICROSCOPIC  TROPONIN I  HEPATIC FUNCTION PANEL  LIPASE, BLOOD  CK  TROPONIN I  POC OCCULT BLOOD, ED    EKG  EKG Interpretation  Date/Time:  Friday July 30 2016 01:57:28 EDT Ventricular Rate:  77 PR Interval:    QRS Duration: 99 QT Interval:  394 QTC Calculation: 446 R Axis:   -43 Text Interpretation:  Sinus rhythm Abnormal R-wave progression, late transition Inferior infarct, old No significant change was found Confirmed by Ezequiel Essex [phone]) on 07/30/2016 3:29:20 AM       Radiology Ct Abdomen Pelvis W Contrast  Result Date: 07/30/2016 CLINICAL DATA:  Periumbilical pain for 4 days.  Weakness. EXAM: CT ABDOMEN AND PELVIS WITH CONTRAST TECHNIQUE: Multidetector CT imaging of the abdomen and pelvis was performed using the standard protocol following bolus administration of intravenous contrast.  CONTRAST:  177mL ISOVUE-300 IOPAMIDOL (ISOVUE-300) INJECTION 61% COMPARISON:  04/22/2012 FINDINGS: Lower chest: No acute abnormality. Hepatobiliary: No focal liver abnormality is seen. Status post cholecystectomy. No biliary dilatation. Pancreas: Unremarkable. No pancreatic ductal dilatation or surrounding inflammatory changes. Spleen: Normal in size without focal abnormality. Adrenals/Urinary Tract: Adrenal glands are unremarkable. Benign right renal cysts measuring up to 3 cm. The kidneys are otherwise normal, without renal calculi, suspicious focal lesion, or hydronephrosis. Bladder is unremarkable. Stomach/Bowel: Stomach is within normal limits. Appendix is normal. No evidence of bowel wall thickening, distention, or inflammatory changes. Vascular/Lymphatic: The abdominal aorta is normal in caliber with moderate atherosclerotic calcification. No adenopathy in the abdomen or pelvis. Reproductive: Unremarkable Other: No focal inflammation.  No ascites. Musculoskeletal: No significant skeletal lesion. IMPRESSION: Aortic atherosclerosis.  No acute findings. Electronically Signed   By: Andreas Newport M.D.   On: 07/30/2016 03:23    Procedures Procedures (including critical care time)  Medications  Ordered in ED Medications  sodium chloride 0.9 % bolus 1,000 mL (not administered)  ondansetron (ZOFRAN) injection 4 mg (not administered)     Initial Impression / Assessment and Plan / ED Course  I have reviewed the triage vital signs and the nursing notes.  Pertinent labs & imaging results that were available during my care of the patient were reviewed by me and considered in my medical decision making (see chart for details).     Patient with generalized weakness, hot spells, near syncope. Seen in the ED 4 days ago for similar symptoms.  Exam is nonfocal. Patient describes chills, body aches, feeling of going to pass out. CTA of chest abdomen and pelvis was done 4 days ago and negative.  EKG is  unchanged. No acute ischemia. Labs are reassuring with minimally elevated creatinine and normal CK. Patient given IV fluids. Hemoglobin is stable. Hemoccult is negative.  No fever in the ED. No evidence of infection. Urinalysis negative. With his periumbilical abdominal pain, CT scan was obtained to evaluate appendix which was normal.  Unclear etiology of patient's hot flashes and generalized weakness. Orthostatics are negative his in no distress and tolerating by mouth. Troponin negative 2. Doubt ACS.  Patient to follow-up with his PCP. Return precautions discussed.  Final Clinical Impressions(s) / ED Diagnoses   Final diagnoses:  Generalized weakness  Near syncope  Hot flash in male    New Prescriptions New Prescriptions   No medications on file     Ezequiel Essex, MD 07/30/16 [phone]

## 2016-07-30 NOTE — Discharge Instructions (Signed)
Your testing is reassuring. Keep yourself hydrated. Follow up with your doctor. Return to the ED if you develop new or worsening symptoms.

## 2016-07-30 NOTE — ED Notes (Signed)
Fluids given.

## 2016-10-29 DIAGNOSIS — Z0000 Encounter for general adult medical examination without abnormal findings: Secondary | ICD-10-CM | POA: Diagnosis not present

## 2016-10-29 DIAGNOSIS — M545 Low back pain: Secondary | ICD-10-CM | POA: Diagnosis not present

## 2016-10-29 DIAGNOSIS — E784 Other hyperlipidemia: Secondary | ICD-10-CM | POA: Diagnosis not present

## 2016-10-29 DIAGNOSIS — I10 Essential (primary) hypertension: Secondary | ICD-10-CM | POA: Diagnosis not present

## 2016-11-22 DIAGNOSIS — Z136 Encounter for screening for cardiovascular disorders: Secondary | ICD-10-CM | POA: Diagnosis not present

## 2016-11-22 DIAGNOSIS — Z87891 Personal history of nicotine dependence: Secondary | ICD-10-CM | POA: Diagnosis not present

## 2016-11-30 ENCOUNTER — Ambulatory Visit (INDEPENDENT_AMBULATORY_CARE_PROVIDER_SITE_OTHER): Payer: Medicare Other | Admitting: *Deleted

## 2016-11-30 DIAGNOSIS — Z23 Encounter for immunization: Secondary | ICD-10-CM

## 2017-01-28 DIAGNOSIS — G603 Idiopathic progressive neuropathy: Secondary | ICD-10-CM | POA: Diagnosis not present

## 2017-01-28 DIAGNOSIS — M545 Low back pain: Secondary | ICD-10-CM | POA: Diagnosis not present

## 2017-01-28 DIAGNOSIS — E7849 Other hyperlipidemia: Secondary | ICD-10-CM | POA: Diagnosis not present

## 2017-01-28 DIAGNOSIS — I10 Essential (primary) hypertension: Secondary | ICD-10-CM | POA: Diagnosis not present

## 2017-02-11 DIAGNOSIS — Z125 Encounter for screening for malignant neoplasm of prostate: Secondary | ICD-10-CM | POA: Diagnosis not present

## 2017-02-11 DIAGNOSIS — E7849 Other hyperlipidemia: Secondary | ICD-10-CM | POA: Diagnosis not present

## 2017-02-11 DIAGNOSIS — I10 Essential (primary) hypertension: Secondary | ICD-10-CM | POA: Diagnosis not present

## 2017-02-11 DIAGNOSIS — M545 Low back pain: Secondary | ICD-10-CM | POA: Diagnosis not present

## 2017-02-11 DIAGNOSIS — G603 Idiopathic progressive neuropathy: Secondary | ICD-10-CM | POA: Diagnosis not present

## 2017-02-11 DIAGNOSIS — Z0000 Encounter for general adult medical examination without abnormal findings: Secondary | ICD-10-CM | POA: Diagnosis not present

## 2017-04-13 DIAGNOSIS — Z87891 Personal history of nicotine dependence: Secondary | ICD-10-CM | POA: Diagnosis not present

## 2017-04-13 DIAGNOSIS — Z833 Family history of diabetes mellitus: Secondary | ICD-10-CM | POA: Diagnosis not present

## 2017-04-13 DIAGNOSIS — I10 Essential (primary) hypertension: Secondary | ICD-10-CM | POA: Diagnosis not present

## 2017-04-15 ENCOUNTER — Other Ambulatory Visit: Payer: Self-pay

## 2017-04-15 ENCOUNTER — Encounter (HOSPITAL_COMMUNITY): Payer: Self-pay

## 2017-04-15 ENCOUNTER — Emergency Department (HOSPITAL_COMMUNITY)
Admission: EM | Admit: 2017-04-15 | Discharge: 2017-04-15 | Disposition: A | Discharge status: Home / Self Care | Payer: Medicare HMO | Attending: Emergency Medicine | Admitting: Emergency Medicine

## 2017-04-15 DIAGNOSIS — T7840XA Allergy, unspecified, initial encounter: Secondary | ICD-10-CM | POA: Diagnosis not present

## 2017-04-15 DIAGNOSIS — Z87891 Personal history of nicotine dependence: Secondary | ICD-10-CM | POA: Diagnosis not present

## 2017-04-15 DIAGNOSIS — L299 Pruritus, unspecified: Secondary | ICD-10-CM | POA: Diagnosis not present

## 2017-04-15 DIAGNOSIS — Z79899 Other long term (current) drug therapy: Secondary | ICD-10-CM | POA: Insufficient documentation

## 2017-04-15 DIAGNOSIS — I10 Essential (primary) hypertension: Secondary | ICD-10-CM | POA: Diagnosis not present

## 2017-04-15 MED ORDER — PREDNISONE 10 MG PO TABS: 20.0000 mg | ORAL_TABLET | Freq: Two times a day (BID) | ORAL | 0 refills | Status: DC

## 2017-04-15 MED ORDER — HYDROXYZINE HCL 25 MG PO TABS
25.0000 mg | ORAL_TABLET | Freq: Once | ORAL | Status: AC
  Administered 2017-04-15: 25 mg via ORAL
  Filled 2017-04-15: qty 1

## 2017-04-15 MED ORDER — HYDROXYZINE HCL 25 MG PO TABS: 25.0000 mg | ORAL_TABLET | Freq: Four times a day (QID) | ORAL | 0 refills | Status: DC

## 2017-04-15 MED ORDER — PREDNISONE 20 MG PO TABS
20.0000 mg | ORAL_TABLET | Freq: Once | ORAL | Status: AC
  Administered 2017-04-15: 20 mg via ORAL
  Filled 2017-04-15: qty 1

## 2017-04-15 NOTE — ED Provider Notes (Signed)
[facility] EMERGENCY DEPARTMENT Provider Note   CSN: [id number] Arrival date & time: 04/15/17  0428     History   Chief Complaint Chief Complaint  Patient presents with  . Allergic Reaction    HPI Aurther Harlin is a 67 y.o. male.  Patient is a 67 year old male with a past medical history of hypertension.  He presents today for evaluation of itching.  This began yesterday and is worsening.  It woke him up from sleep this morning and he presents for evaluation of it.  Reports itching to his arms and legs.  He denies any throat swelling or difficulty breathing.  He denies any new contacts or exposures.   The history is provided by the patient.  Allergic Reaction  Presenting symptoms: itching   Presenting symptoms: no difficulty breathing and no difficulty swallowing   Severity:  Moderate Duration:  1 day Relieved by:  Nothing Worsened by:  Nothing Ineffective treatments:  None tried   Past Medical History:  Diagnosis Date  . Hypertension   . Ulcer     There are no active problems to display for this patient.   Past Surgical History:  Procedure Laterality Date  . BLADDER SURGERY         Home Medications    Prior to Admission medications   Medication Sig Start Date End Date Taking? Authorizing Provider  amlodipine-olmesartan (AZOR) 10-20 MG per tablet Take 1 tablet by mouth daily.    [provider]  chlorthalidone (HYGROTON) 25 MG tablet Take 25 mg by mouth daily.    [provider]  Cholecalciferol (VITAMIN D) 2000 UNITS CAPS Take 1 capsule by mouth daily.    [provider]  fish oil-omega-3 fatty acids 1000 MG capsule Take 1 g by mouth daily.    [provider]  methocarbamol (ROBAXIN) 500 MG tablet Take 1 tablet (500 mg total) by mouth every 8 (eight) hours as needed for muscle spasms. 07/26/16   Orpah Greek, MD    Family History No family history on file.  Social History Social History   Tobacco Use    . Smoking status: Former Research scientist (life sciences)  . Smokeless tobacco: Never Used  Substance Use Topics  . Alcohol use: No  . Drug use: No     Allergies   Patient has no known allergies.   Review of Systems Review of Systems  HENT: Negative for trouble swallowing.   Skin: Positive for itching.  All other systems reviewed and are negative.    Physical Exam Updated Vital Signs BP (!) 168/76 (BP Location: Left Arm)   Pulse 96   Temp 98.4 F (36.9 C) (Oral)   Resp 18   Ht 6' (1.829 m)   Wt 102.1 kg (225 lb)   SpO2 99%   BMI 30.52 kg/m   Physical Exam  Constitutional: He is oriented to person, place, and time. He appears well-developed and well-nourished. No distress.  HENT:  Head: Normocephalic and atraumatic.  Mouth/Throat: Oropharynx is clear and moist.  Neck: Normal range of motion. Neck supple.  Cardiovascular: Normal rate and regular rhythm. Exam reveals no friction rub.  No murmur heard. Pulmonary/Chest: Effort normal and breath sounds normal. No respiratory distress. He has no wheezes. He has no rales.  Abdominal: Soft. Bowel sounds are normal. He exhibits no distension. There is no tenderness.  Musculoskeletal: Normal range of motion. He exhibits no edema.  Neurological: He is alert and oriented to person, place, and time. Coordination normal.  Skin: Skin  is warm and dry. No rash noted. He is not diaphoretic.  Nursing note and vitals reviewed.    ED Treatments / Results  Labs (all labs ordered are listed, but only abnormal results are displayed) Labs Reviewed - No data to display  EKG  EKG Interpretation None       Radiology No results found.  Procedures Procedures (including critical care time)  Medications Ordered in ED Medications  predniSONE (DELTASONE) tablet 20 mg (not administered)  hydrOXYzine (ATARAX/VISTARIL) tablet 25 mg (not administered)     Initial Impression / Assessment and Plan / ED Course  I have reviewed the triage vital signs and  the nursing notes.  Pertinent labs & imaging results that were available during my care of the patient were reviewed by me and considered in my medical decision making (see chart for details).  Patient presents here with itching to his arms and feeling warm.  His physical examination is unremarkable.  I doubt any emergent process.  He will be given prednisone and hydroxyzine for what I suspect is some sort of allergic reaction.  He is to return as needed for any problems.  Final Clinical Impressions(s) / ED Diagnoses   Final diagnoses:  None    ED Discharge Orders    None       Veryl Speak, MD 04/15/17 [phone]

## 2017-04-15 NOTE — ED Triage Notes (Signed)
Pt states he awoke this am feeling "hot" eyes red, itching over his torso, no obvious rash or hives.  Pt states he has been congested and has had a cough.  No new meds or foods, but did eat some fish yesterday.

## 2017-04-15 NOTE — Discharge Instructions (Signed)
Prednisone and hydroxyzine as prescribed.  Return to the emergency department if you develop chest pain, difficulty breathing or swallowing, or other new and concerning symptoms.

## 2017-05-03 DIAGNOSIS — M545 Low back pain: Secondary | ICD-10-CM | POA: Diagnosis not present

## 2017-05-03 DIAGNOSIS — G603 Idiopathic progressive neuropathy: Secondary | ICD-10-CM | POA: Diagnosis not present

## 2017-05-03 DIAGNOSIS — I10 Essential (primary) hypertension: Secondary | ICD-10-CM | POA: Diagnosis not present

## 2017-05-03 DIAGNOSIS — Z6830 Body mass index (BMI) 30.0-30.9, adult: Secondary | ICD-10-CM | POA: Diagnosis not present

## 2017-05-03 DIAGNOSIS — E7849 Other hyperlipidemia: Secondary | ICD-10-CM | POA: Diagnosis not present

## 2017-05-03 DIAGNOSIS — L278 Dermatitis due to other substances taken internally: Secondary | ICD-10-CM | POA: Diagnosis not present

## 2017-07-07 IMAGING — CT CT ABD-PELV W/ CM
2 of 4 series · 17 of 46 positions shown, 19 images · IV contrast (Isovue)
Comparison: 04/22/2012

CLINICAL DATA: Periumbilical pain for 4 days.  Weakness.

EXAM:
CT ABDOMEN AND PELVIS WITH CONTRAST
TECHNIQUE: Multidetector CT imaging of the abdomen and pelvis was performed
using the standard protocol following bolus administration of
intravenous contrast.
CONTRAST:  100mL JNRYTG-D99 IOPAMIDOL (JNRYTG-D99) INJECTION 61%

[Series 2: axial st · axial · 0.82mm/px · z∈[+1054,+1508]mm · 14 of 101 slices shown, 16 images]
[im 5/101  soft-tissue]
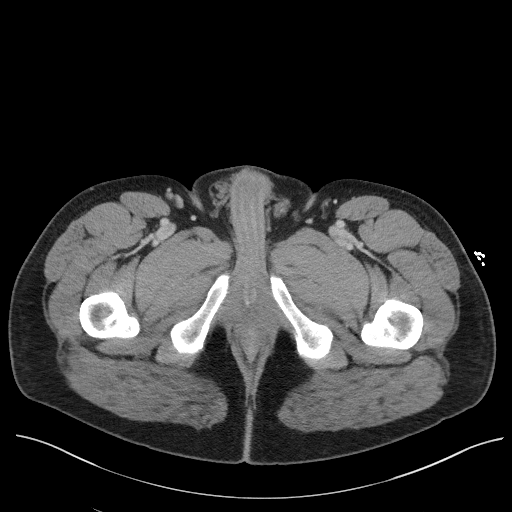
[im 5/101  bone]
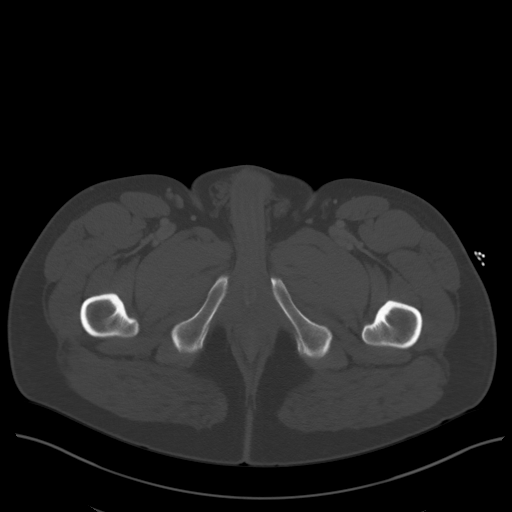
[im 14/101  soft-tissue]
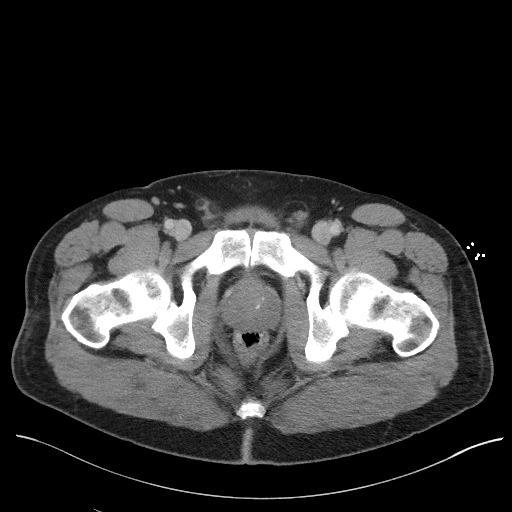
[im 19/101  soft-tissue]
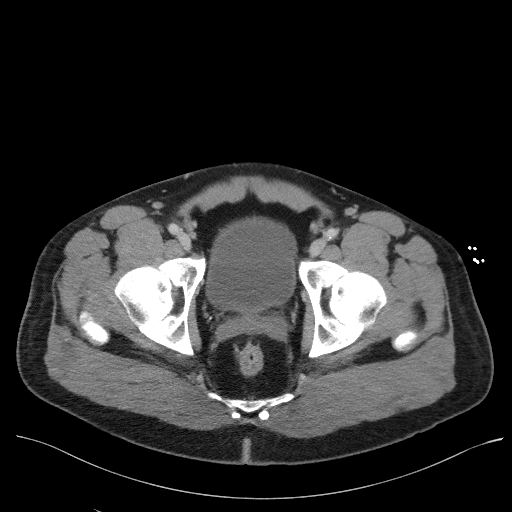
[im 28/101  soft-tissue]
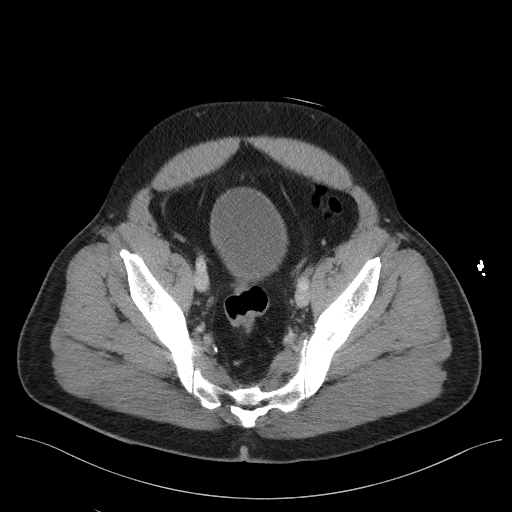
[im 32/101  soft-tissue]
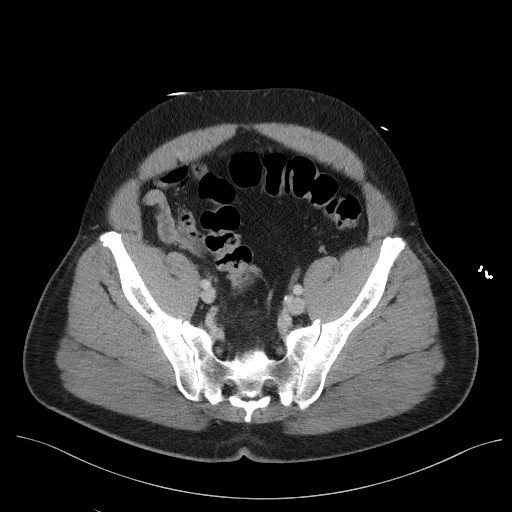
[im 41/101  soft-tissue]
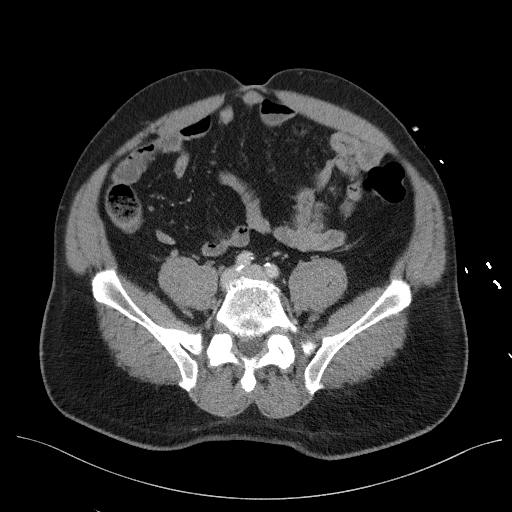
[im 46/101  soft-tissue]
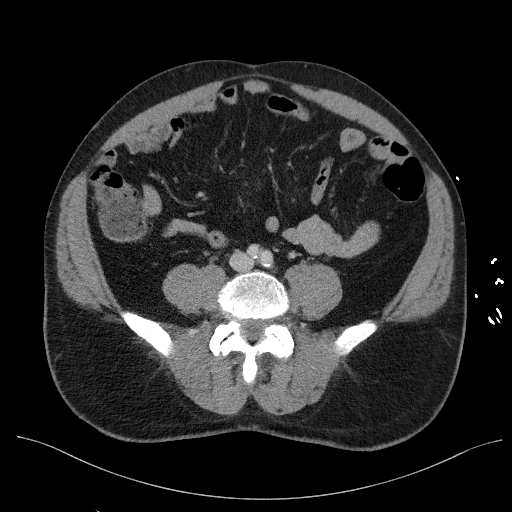
[im 55/101  soft-tissue]
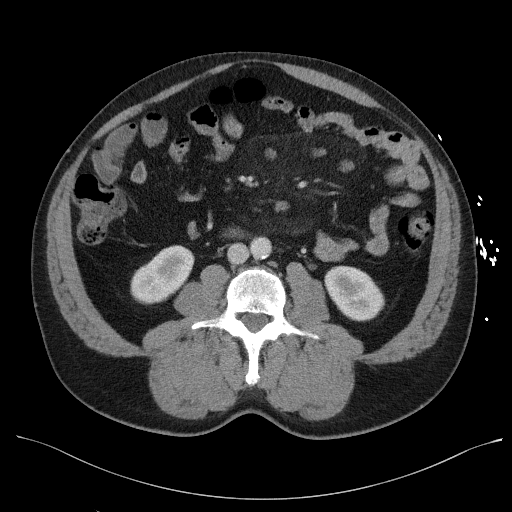
[im 60/101  soft-tissue]
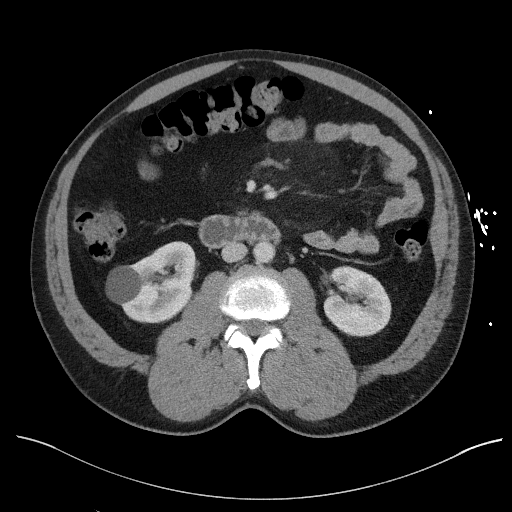
[im 60/101  bone]
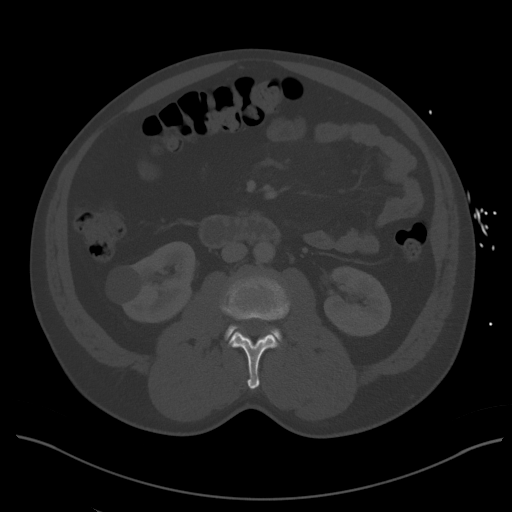
[im 69/101  soft-tissue]
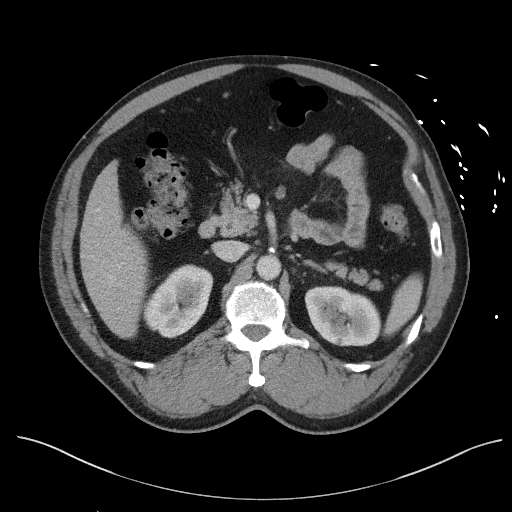
[im 73/101  soft-tissue]
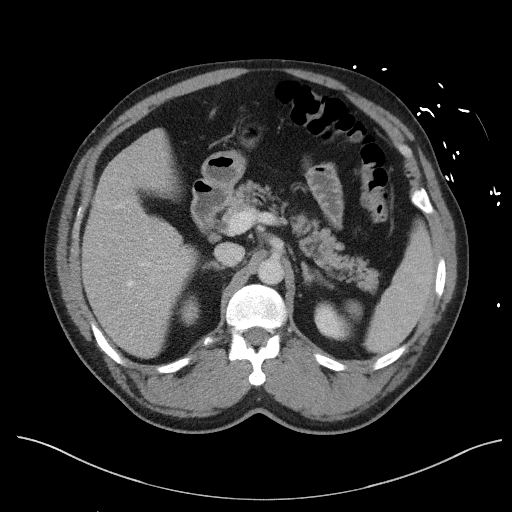
[im 82/101  soft-tissue]
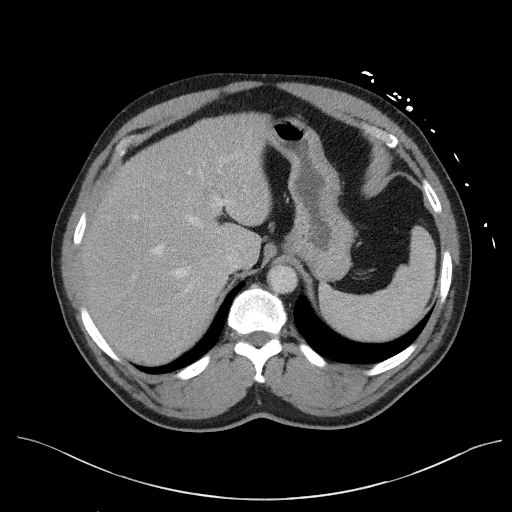
[im 87/101  soft-tissue]
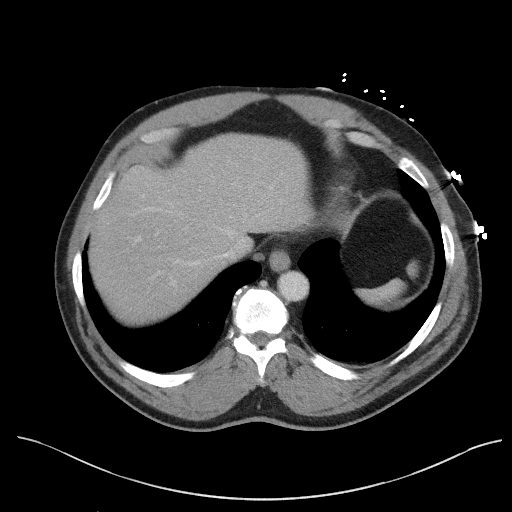
[im 96/101  soft-tissue]
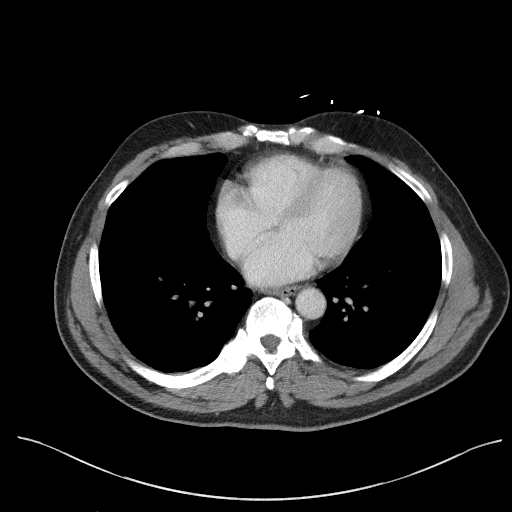

[Series 5: coronal st · coronal · 0.88mm/px · 3 of 108 slices shown]
[im 36/108  soft-tissue]
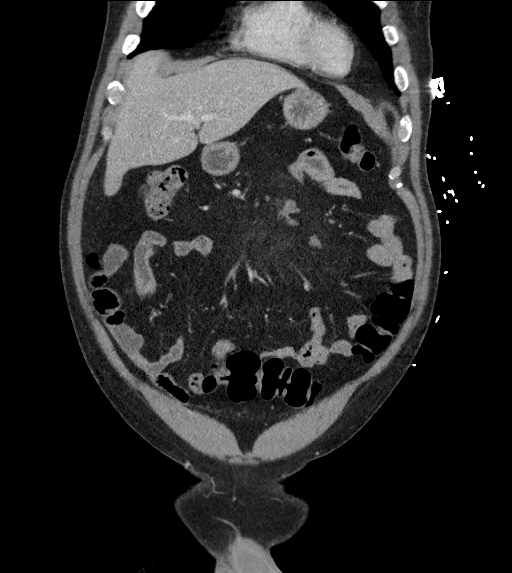
[im 48/108  soft-tissue]
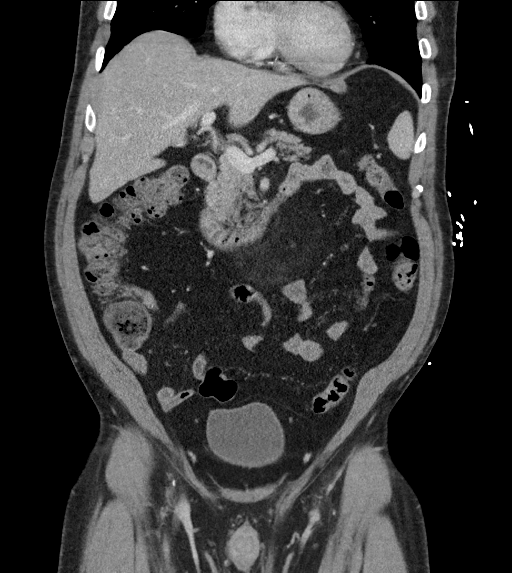
[im 60/108  soft-tissue]
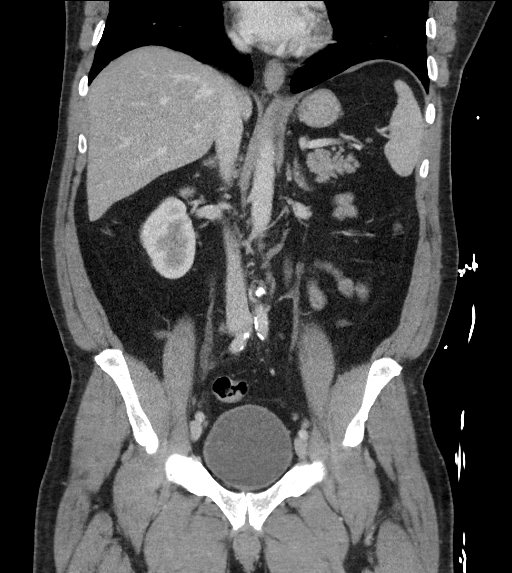

[17 of 46 positions shown; findings below may reference images not displayed]

FINDINGS: Lower chest: No acute abnormality.

Hepatobiliary: No focal liver abnormality is seen. Status post
cholecystectomy. No biliary dilatation.

Pancreas: Unremarkable. No pancreatic ductal dilatation or
surrounding inflammatory changes.

Spleen: Normal in size without focal abnormality.

Adrenals/Urinary Tract: Adrenal glands are unremarkable. Benign
right renal cysts measuring up to 3 cm. The kidneys are otherwise
normal, without renal calculi, suspicious focal lesion, or
hydronephrosis. Bladder is unremarkable.

Stomach/Bowel: Stomach is within normal limits. Appendix is normal.
No evidence of bowel wall thickening, distention, or inflammatory
changes.

Vascular/Lymphatic: The abdominal aorta is normal in caliber with
moderate atherosclerotic calcification. No adenopathy in the abdomen
or pelvis.

Reproductive: Unremarkable

Other: No focal inflammation.  No ascites.

Musculoskeletal: No significant skeletal lesion.
IMPRESSION: Aortic atherosclerosis.  No acute findings.

## 2017-08-02 DIAGNOSIS — E7849 Other hyperlipidemia: Secondary | ICD-10-CM | POA: Diagnosis not present

## 2017-08-02 DIAGNOSIS — M545 Low back pain: Secondary | ICD-10-CM | POA: Diagnosis not present

## 2017-08-02 DIAGNOSIS — I10 Essential (primary) hypertension: Secondary | ICD-10-CM | POA: Diagnosis not present

## 2017-08-02 DIAGNOSIS — L278 Dermatitis due to other substances taken internally: Secondary | ICD-10-CM | POA: Diagnosis not present

## 2017-08-02 DIAGNOSIS — Z6830 Body mass index (BMI) 30.0-30.9, adult: Secondary | ICD-10-CM | POA: Diagnosis not present

## 2017-08-02 DIAGNOSIS — G603 Idiopathic progressive neuropathy: Secondary | ICD-10-CM | POA: Diagnosis not present

## 2017-11-02 DIAGNOSIS — Z6831 Body mass index (BMI) 31.0-31.9, adult: Secondary | ICD-10-CM | POA: Diagnosis not present

## 2017-11-02 DIAGNOSIS — G603 Idiopathic progressive neuropathy: Secondary | ICD-10-CM | POA: Diagnosis not present

## 2017-11-02 DIAGNOSIS — E7849 Other hyperlipidemia: Secondary | ICD-10-CM | POA: Diagnosis not present

## 2017-11-02 DIAGNOSIS — M545 Low back pain: Secondary | ICD-10-CM | POA: Diagnosis not present

## 2017-11-02 DIAGNOSIS — I10 Essential (primary) hypertension: Secondary | ICD-10-CM | POA: Diagnosis not present

## 2017-11-02 DIAGNOSIS — Z0000 Encounter for general adult medical examination without abnormal findings: Secondary | ICD-10-CM | POA: Diagnosis not present

## 2017-11-24 DIAGNOSIS — M810 Age-related osteoporosis without current pathological fracture: Secondary | ICD-10-CM | POA: Diagnosis not present

## 2017-11-24 DIAGNOSIS — Z13820 Encounter for screening for osteoporosis: Secondary | ICD-10-CM | POA: Diagnosis not present

## 2017-12-01 DIAGNOSIS — R69 Illness, unspecified: Secondary | ICD-10-CM | POA: Diagnosis not present

## 2018-01-19 DIAGNOSIS — E872 Acidosis: Secondary | ICD-10-CM | POA: Diagnosis not present

## 2018-01-19 DIAGNOSIS — N390 Urinary tract infection, site not specified: Secondary | ICD-10-CM | POA: Diagnosis not present

## 2018-01-19 DIAGNOSIS — N184 Chronic kidney disease, stage 4 (severe): Secondary | ICD-10-CM | POA: Diagnosis not present

## 2018-01-19 DIAGNOSIS — E860 Dehydration: Secondary | ICD-10-CM | POA: Diagnosis not present

## 2018-01-19 DIAGNOSIS — N179 Acute kidney failure, unspecified: Secondary | ICD-10-CM | POA: Diagnosis not present

## 2018-01-20 DIAGNOSIS — N390 Urinary tract infection, site not specified: Secondary | ICD-10-CM | POA: Diagnosis not present

## 2018-01-20 DIAGNOSIS — E872 Acidosis: Secondary | ICD-10-CM | POA: Diagnosis not present

## 2018-01-20 DIAGNOSIS — N184 Chronic kidney disease, stage 4 (severe): Secondary | ICD-10-CM | POA: Diagnosis not present

## 2018-01-20 DIAGNOSIS — E860 Dehydration: Secondary | ICD-10-CM | POA: Diagnosis not present

## 2018-01-20 DIAGNOSIS — N179 Acute kidney failure, unspecified: Secondary | ICD-10-CM | POA: Diagnosis not present

## 2018-01-21 DIAGNOSIS — N390 Urinary tract infection, site not specified: Secondary | ICD-10-CM | POA: Diagnosis not present

## 2018-01-21 DIAGNOSIS — E872 Acidosis: Secondary | ICD-10-CM | POA: Diagnosis not present

## 2018-01-21 DIAGNOSIS — N184 Chronic kidney disease, stage 4 (severe): Secondary | ICD-10-CM | POA: Diagnosis not present

## 2018-01-21 DIAGNOSIS — N179 Acute kidney failure, unspecified: Secondary | ICD-10-CM | POA: Diagnosis not present

## 2018-01-21 DIAGNOSIS — E860 Dehydration: Secondary | ICD-10-CM | POA: Diagnosis not present

## 2018-01-22 DIAGNOSIS — N390 Urinary tract infection, site not specified: Secondary | ICD-10-CM | POA: Diagnosis not present

## 2018-01-22 DIAGNOSIS — E860 Dehydration: Secondary | ICD-10-CM | POA: Diagnosis not present

## 2018-01-22 DIAGNOSIS — E872 Acidosis: Secondary | ICD-10-CM | POA: Diagnosis not present

## 2018-01-22 DIAGNOSIS — N179 Acute kidney failure, unspecified: Secondary | ICD-10-CM | POA: Diagnosis not present

## 2018-01-22 DIAGNOSIS — N184 Chronic kidney disease, stage 4 (severe): Secondary | ICD-10-CM | POA: Diagnosis not present

## 2018-01-23 DIAGNOSIS — N179 Acute kidney failure, unspecified: Secondary | ICD-10-CM | POA: Diagnosis not present

## 2018-01-23 DIAGNOSIS — N184 Chronic kidney disease, stage 4 (severe): Secondary | ICD-10-CM | POA: Diagnosis not present

## 2018-01-23 DIAGNOSIS — E860 Dehydration: Secondary | ICD-10-CM | POA: Diagnosis not present

## 2018-01-23 DIAGNOSIS — N390 Urinary tract infection, site not specified: Secondary | ICD-10-CM | POA: Diagnosis not present

## 2018-01-23 DIAGNOSIS — E872 Acidosis: Secondary | ICD-10-CM | POA: Diagnosis not present

## 2018-02-02 DIAGNOSIS — N182 Chronic kidney disease, stage 2 (mild): Secondary | ICD-10-CM | POA: Diagnosis not present

## 2018-02-02 DIAGNOSIS — M545 Low back pain: Secondary | ICD-10-CM | POA: Diagnosis not present

## 2018-02-02 DIAGNOSIS — I10 Essential (primary) hypertension: Secondary | ICD-10-CM | POA: Diagnosis not present

## 2018-02-02 DIAGNOSIS — G603 Idiopathic progressive neuropathy: Secondary | ICD-10-CM | POA: Diagnosis not present

## 2018-02-02 DIAGNOSIS — Z6831 Body mass index (BMI) 31.0-31.9, adult: Secondary | ICD-10-CM | POA: Diagnosis not present

## 2018-02-02 DIAGNOSIS — E7849 Other hyperlipidemia: Secondary | ICD-10-CM | POA: Diagnosis not present

## 2018-02-02 DIAGNOSIS — Z0000 Encounter for general adult medical examination without abnormal findings: Secondary | ICD-10-CM | POA: Diagnosis not present

## 2018-05-10 DIAGNOSIS — E7849 Other hyperlipidemia: Secondary | ICD-10-CM | POA: Diagnosis not present

## 2018-05-10 DIAGNOSIS — M545 Low back pain: Secondary | ICD-10-CM | POA: Diagnosis not present

## 2018-05-10 DIAGNOSIS — I10 Essential (primary) hypertension: Secondary | ICD-10-CM | POA: Diagnosis not present

## 2018-05-10 DIAGNOSIS — J309 Allergic rhinitis, unspecified: Secondary | ICD-10-CM | POA: Diagnosis not present

## 2018-05-10 DIAGNOSIS — G603 Idiopathic progressive neuropathy: Secondary | ICD-10-CM | POA: Diagnosis not present

## 2018-05-10 DIAGNOSIS — N182 Chronic kidney disease, stage 2 (mild): Secondary | ICD-10-CM | POA: Diagnosis not present

## 2018-05-10 DIAGNOSIS — Z6830 Body mass index (BMI) 30.0-30.9, adult: Secondary | ICD-10-CM | POA: Diagnosis not present

## 2018-06-23 DIAGNOSIS — I10 Essential (primary) hypertension: Secondary | ICD-10-CM | POA: Diagnosis not present

## 2018-06-23 DIAGNOSIS — W19XXXA Unspecified fall, initial encounter: Secondary | ICD-10-CM | POA: Diagnosis not present

## 2018-08-24 DIAGNOSIS — I10 Essential (primary) hypertension: Secondary | ICD-10-CM | POA: Diagnosis not present

## 2018-08-24 DIAGNOSIS — Z125 Encounter for screening for malignant neoplasm of prostate: Secondary | ICD-10-CM | POA: Diagnosis not present

## 2018-08-24 DIAGNOSIS — Z6831 Body mass index (BMI) 31.0-31.9, adult: Secondary | ICD-10-CM | POA: Diagnosis not present

## 2018-08-24 DIAGNOSIS — E7849 Other hyperlipidemia: Secondary | ICD-10-CM | POA: Diagnosis not present

## 2018-08-24 DIAGNOSIS — G603 Idiopathic progressive neuropathy: Secondary | ICD-10-CM | POA: Diagnosis not present

## 2018-08-24 DIAGNOSIS — N182 Chronic kidney disease, stage 2 (mild): Secondary | ICD-10-CM | POA: Diagnosis not present

## 2018-08-24 DIAGNOSIS — J309 Allergic rhinitis, unspecified: Secondary | ICD-10-CM | POA: Diagnosis not present

## 2018-08-24 DIAGNOSIS — M545 Low back pain: Secondary | ICD-10-CM | POA: Diagnosis not present

## 2018-11-09 DIAGNOSIS — Z23 Encounter for immunization: Secondary | ICD-10-CM | POA: Diagnosis not present

## 2018-11-23 DIAGNOSIS — I10 Essential (primary) hypertension: Secondary | ICD-10-CM | POA: Diagnosis not present

## 2018-11-23 DIAGNOSIS — J3089 Other allergic rhinitis: Secondary | ICD-10-CM | POA: Diagnosis not present

## 2018-11-23 DIAGNOSIS — E7849 Other hyperlipidemia: Secondary | ICD-10-CM | POA: Diagnosis not present

## 2018-11-23 DIAGNOSIS — Z0000 Encounter for general adult medical examination without abnormal findings: Secondary | ICD-10-CM | POA: Diagnosis not present

## 2018-11-23 DIAGNOSIS — N182 Chronic kidney disease, stage 2 (mild): Secondary | ICD-10-CM | POA: Diagnosis not present

## 2018-11-23 DIAGNOSIS — G603 Idiopathic progressive neuropathy: Secondary | ICD-10-CM | POA: Diagnosis not present

## 2018-11-23 DIAGNOSIS — M545 Low back pain: Secondary | ICD-10-CM | POA: Diagnosis not present

## 2018-11-23 DIAGNOSIS — Z6831 Body mass index (BMI) 31.0-31.9, adult: Secondary | ICD-10-CM | POA: Diagnosis not present

## 2018-11-23 DIAGNOSIS — Z1389 Encounter for screening for other disorder: Secondary | ICD-10-CM | POA: Diagnosis not present

## 2018-11-30 DIAGNOSIS — I739 Peripheral vascular disease, unspecified: Secondary | ICD-10-CM | POA: Diagnosis not present

## 2019-02-26 DIAGNOSIS — Z0000 Encounter for general adult medical examination without abnormal findings: Secondary | ICD-10-CM | POA: Diagnosis not present

## 2019-02-26 DIAGNOSIS — Z6832 Body mass index (BMI) 32.0-32.9, adult: Secondary | ICD-10-CM | POA: Diagnosis not present

## 2019-02-26 DIAGNOSIS — I10 Essential (primary) hypertension: Secondary | ICD-10-CM | POA: Diagnosis not present

## 2019-02-26 DIAGNOSIS — E7849 Other hyperlipidemia: Secondary | ICD-10-CM | POA: Diagnosis not present

## 2019-02-26 DIAGNOSIS — N182 Chronic kidney disease, stage 2 (mild): Secondary | ICD-10-CM | POA: Diagnosis not present

## 2019-02-26 DIAGNOSIS — J3089 Other allergic rhinitis: Secondary | ICD-10-CM | POA: Diagnosis not present

## 2019-02-26 DIAGNOSIS — M545 Low back pain: Secondary | ICD-10-CM | POA: Diagnosis not present

## 2019-02-26 DIAGNOSIS — G603 Idiopathic progressive neuropathy: Secondary | ICD-10-CM | POA: Diagnosis not present

## 2019-05-28 DIAGNOSIS — G603 Idiopathic progressive neuropathy: Secondary | ICD-10-CM | POA: Diagnosis not present

## 2019-05-28 DIAGNOSIS — E7849 Other hyperlipidemia: Secondary | ICD-10-CM | POA: Diagnosis not present

## 2019-05-28 DIAGNOSIS — I10 Essential (primary) hypertension: Secondary | ICD-10-CM | POA: Diagnosis not present

## 2019-05-28 DIAGNOSIS — Z6831 Body mass index (BMI) 31.0-31.9, adult: Secondary | ICD-10-CM | POA: Diagnosis not present

## 2019-05-28 DIAGNOSIS — M545 Low back pain: Secondary | ICD-10-CM | POA: Diagnosis not present

## 2019-05-28 DIAGNOSIS — J3089 Other allergic rhinitis: Secondary | ICD-10-CM | POA: Diagnosis not present

## 2019-05-28 DIAGNOSIS — N182 Chronic kidney disease, stage 2 (mild): Secondary | ICD-10-CM | POA: Diagnosis not present

## 2019-05-30 ENCOUNTER — Other Ambulatory Visit (HOSPITAL_COMMUNITY): Payer: Self-pay | Admitting: Respiratory Therapy

## 2019-05-30 DIAGNOSIS — Z1383 Encounter for screening for respiratory disorder NEC: Secondary | ICD-10-CM

## 2019-08-27 DIAGNOSIS — N182 Chronic kidney disease, stage 2 (mild): Secondary | ICD-10-CM | POA: Diagnosis not present

## 2019-08-27 DIAGNOSIS — E7849 Other hyperlipidemia: Secondary | ICD-10-CM | POA: Diagnosis not present

## 2019-08-27 DIAGNOSIS — Z6832 Body mass index (BMI) 32.0-32.9, adult: Secondary | ICD-10-CM | POA: Diagnosis not present

## 2019-08-27 DIAGNOSIS — I10 Essential (primary) hypertension: Secondary | ICD-10-CM | POA: Diagnosis not present

## 2019-08-27 DIAGNOSIS — M545 Low back pain: Secondary | ICD-10-CM | POA: Diagnosis not present

## 2019-08-27 DIAGNOSIS — G603 Idiopathic progressive neuropathy: Secondary | ICD-10-CM | POA: Diagnosis not present

## 2019-08-27 DIAGNOSIS — Z125 Encounter for screening for malignant neoplasm of prostate: Secondary | ICD-10-CM | POA: Diagnosis not present

## 2019-08-27 DIAGNOSIS — J3089 Other allergic rhinitis: Secondary | ICD-10-CM | POA: Diagnosis not present

## 2019-09-30 ENCOUNTER — Emergency Department (HOSPITAL_COMMUNITY): Payer: Medicare HMO

## 2019-09-30 ENCOUNTER — Encounter (HOSPITAL_COMMUNITY): Payer: Self-pay | Admitting: Emergency Medicine

## 2019-09-30 ENCOUNTER — Emergency Department (HOSPITAL_COMMUNITY)
Admission: EM | Admit: 2019-09-30 | Discharge: 2019-09-30 | Disposition: A | Discharge status: Home / Self Care | Payer: Medicare HMO | Attending: Emergency Medicine | Admitting: Emergency Medicine

## 2019-09-30 ENCOUNTER — Other Ambulatory Visit: Payer: Self-pay

## 2019-09-30 DIAGNOSIS — Z87891 Personal history of nicotine dependence: Secondary | ICD-10-CM | POA: Diagnosis not present

## 2019-09-30 DIAGNOSIS — Z79899 Other long term (current) drug therapy: Secondary | ICD-10-CM | POA: Insufficient documentation

## 2019-09-30 DIAGNOSIS — R079 Chest pain, unspecified: Secondary | ICD-10-CM

## 2019-09-30 DIAGNOSIS — R0789 Other chest pain: Secondary | ICD-10-CM | POA: Diagnosis present

## 2019-09-30 DIAGNOSIS — I10 Essential (primary) hypertension: Secondary | ICD-10-CM | POA: Diagnosis not present

## 2019-09-30 DIAGNOSIS — R072 Precordial pain: Secondary | ICD-10-CM | POA: Diagnosis not present

## 2019-09-30 LAB — CBC
HCT: 38.9 % — ABNORMAL LOW (ref 39.0–52.0)
Hemoglobin: 12.8 g/dL — ABNORMAL LOW (ref 13.0–17.0)
MCH: 28 pg (ref 26.0–34.0)
MCHC: 32.9 g/dL (ref 30.0–36.0)
MCV: 85.1 fL (ref 80.0–100.0)
Platelets: 221 10*3/uL (ref 150–400)
RBC: 4.57 MIL/uL (ref 4.22–5.81)
RDW: 13.8 % (ref 11.5–15.5)
WBC: 5.7 10*3/uL (ref 4.0–10.5)
nRBC: 0 % (ref 0.0–0.2)

## 2019-09-30 LAB — BASIC METABOLIC PANEL
Anion gap: 10 (ref 5–15)
BUN: 34 mg/dL — ABNORMAL HIGH (ref 8–23)
CO2: 24 mmol/L (ref 22–32)
Calcium: 9.8 mg/dL (ref 8.9–10.3)
Chloride: 103 mmol/L (ref 98–111)
Creatinine, Ser: 1.86 mg/dL — ABNORMAL HIGH (ref 0.61–1.24)
GFR calc Af Amer: 42 mL/min — ABNORMAL LOW (ref >60)
GFR calc non Af Amer: 36 mL/min — ABNORMAL LOW (ref >60)
Glucose, Bld: 121 mg/dL — ABNORMAL HIGH (ref 70–99)
Potassium: 4.7 mmol/L (ref 3.5–5.1)
Sodium: 137 mmol/L (ref 135–145)

## 2019-09-30 LAB — TROPONIN I (HIGH SENSITIVITY)
Troponin I (High Sensitivity): 2 ng/L (ref <18)
Troponin I (High Sensitivity): 3 ng/L (ref <18)

## 2019-09-30 LAB — HEPATIC FUNCTION PANEL
ALT: 29 U/L (ref 0–44)
AST: 22 U/L (ref 15–41)
Albumin: 4.8 g/dL (ref 3.5–5.0)
Alkaline Phosphatase: 62 U/L (ref 38–126)
Bilirubin, Direct: 0.1 mg/dL (ref 0.0–0.2)
Indirect Bilirubin: 0.8 mg/dL (ref 0.3–0.9)
Total Bilirubin: 0.9 mg/dL (ref 0.3–1.2)
Total Protein: 8.2 g/dL — ABNORMAL HIGH (ref 6.5–8.1)

## 2019-09-30 NOTE — ED Triage Notes (Signed)
Reports midsternal CP since last night   4/10  Hx lung dx

## 2019-09-30 NOTE — ED Notes (Signed)
C/o chest pain for 2 days, pain is intermittent.  Currently no chest pain.

## 2019-09-30 NOTE — ED Provider Notes (Signed)
[facility] EMERGENCY DEPARTMENT Provider Note   CSN: [id number] Arrival date & time: 09/30/19  1106     History Chief Complaint  Patient presents with  . Chest Pain    Sergio Alvarez is a 69 y.o. male.  HPI Patient presents with chest pain.  Is had for the last 2 to 3 days.  Comes and goes.  Comes on in the morning at times.  Does not come on with exertion or after eating.  It is dull in the mid lower chest.  Patient states he is feeling back to normal now.  No fevers.  No cough.  No nausea vomiting or diarrhea.  States he does cough up some sputum at times.  Former smoker but quit 15 years ago.  No known coronary artery disease.  No swelling in his legs.  Pain is dull.    Past Medical History:  Diagnosis Date  . Hypertension   . Ulcer     There are no problems to display for this patient.   Past Surgical History:  Procedure Laterality Date  . BLADDER SURGERY         No family history on file.  Social History   Tobacco Use  . Smoking status: Former Research scientist (life sciences)  . Smokeless tobacco: Never Used  Substance Use Topics  . Alcohol use: No  . Drug use: No    Home Medications Prior to Admission medications   Medication Sig Start Date End Date Taking? Authorizing Provider  amlodipine-olmesartan (AZOR) 10-20 MG per tablet Take 1 tablet by mouth daily.    [provider]  chlorthalidone (HYGROTON) 25 MG tablet Take 25 mg by mouth daily.    [provider]  Cholecalciferol (VITAMIN D) 2000 UNITS CAPS Take 1 capsule by mouth daily.    [provider]  fish oil-omega-3 fatty acids 1000 MG capsule Take 1 g by mouth daily.    [provider]  hydrOXYzine (ATARAX/VISTARIL) 25 MG tablet Take 1 tablet (25 mg total) by mouth every 6 (six) hours. 04/15/17   Veryl Speak, MD  methocarbamol (ROBAXIN) 500 MG tablet Take 1 tablet (500 mg total) by mouth every 8 (eight) hours as needed for muscle spasms. 07/26/16   Orpah Greek, MD  predniSONE  (DELTASONE) 10 MG tablet Take 2 tablets (20 mg total) by mouth 2 (two) times daily with a meal. 04/15/17   Veryl Speak, MD    Allergies    Patient has no known allergies.  Review of Systems   Review of Systems  Constitutional: Negative for appetite change.  HENT: Negative for congestion.   Respiratory: Negative for shortness of breath.   Cardiovascular: Positive for chest pain.  Gastrointestinal: Negative for abdominal pain.  Genitourinary: Negative for frequency.  Musculoskeletal: Negative for back pain.  Skin: Negative for rash.  Neurological: Negative for weakness.  Psychiatric/Behavioral: Negative for confusion.    Physical Exam Updated Vital Signs BP 127/79 (BP Location: Right Arm)   Pulse 84   Temp 98.9 F (37.2 C) (Oral)   Resp 16   Ht 6' (1.829 m)   Wt 104.8 kg   SpO2 100%   BMI 31.33 kg/m   Physical Exam Vitals and nursing note reviewed.  HENT:     Head: Normocephalic.  Cardiovascular:     Rate and Rhythm: Normal rate and regular rhythm.     Heart sounds: No murmur heard.   Pulmonary:     Breath sounds: No wheezing or rhonchi.  Chest:  Chest wall: No tenderness.  Abdominal:     Tenderness: There is no abdominal tenderness.  Musculoskeletal:     Right lower leg: No edema.     Left lower leg: No edema.  Skin:    General: Skin is warm.     Capillary Refill: Capillary refill takes less than 2 seconds.  Neurological:     Mental Status: He is alert and oriented to person, place, and time.     ED Results / Procedures / Treatments   Labs (all labs ordered are listed, but only abnormal results are displayed) Labs Reviewed  BASIC METABOLIC PANEL - Abnormal; Notable for the following components:      Result Value   Glucose, Bld 121 (*)    BUN 34 (*)    Creatinine, Ser 1.86 (*)    GFR calc non Af Amer 36 (*)    GFR calc Af Amer 42 (*)    All other components within normal limits  CBC - Abnormal; Notable for the following components:   Hemoglobin  12.8 (*)    HCT 38.9 (*)    All other components within normal limits  HEPATIC FUNCTION PANEL - Abnormal; Notable for the following components:   Total Protein 8.2 (*)    All other components within normal limits  TROPONIN I (HIGH SENSITIVITY)  TROPONIN I (HIGH SENSITIVITY)    EKG EKG Interpretation  Date/Time:  Sunday September 30 2019 11:14:44 EDT Ventricular Rate:  105 PR Interval:  182 QRS Duration: 74 QT Interval:  318 QTC Calculation: 420 R Axis:   -57 Text Interpretation: Sinus tachycardia Left axis deviation Septal infarct , age undetermined Inferior infarct , age undetermined Abnormal ECG No significant change since last tracing Confirmed by Davonna Belling [phone]) on 09/30/2019 11:27:01 AM   Radiology DG Chest 2 View  Result Date: 09/30/2019 CLINICAL DATA:  Midsternal chest pain. EXAM: CHEST - 2 VIEW COMPARISON:  Chest radiograph dated 07/26/2016 FINDINGS: The heart size and mediastinal contours are within normal limits. Both lungs are clear. The visualized skeletal structures are unremarkable. IMPRESSION: No active cardiopulmonary disease. Electronically Signed   By: Zerita Boers M.D.   On: 09/30/2019 12:38    Procedures Procedures (including critical care time)  Medications Ordered in ED Medications - No data to display  ED Course  I have reviewed the triage vital signs and the nursing notes.  Pertinent labs & imaging results that were available during my care of the patient were reviewed by me and considered in my medical decision making (see chart for details).    MDM Rules/Calculators/A&P                          Patient with pain.  Epigastric to lower chest.  Not exertional.  Does not come after eating.  EKG stable.  Troponin negative.  Heart pathway score of 3.  LFTs reassuring.  Creatinine mildly elevated.  Will discharge home with outpatient follow-up doubt cardiac cause.  AAA or aortic dissection felt less likely. Final Clinical Impression(s) / ED  Diagnoses Final diagnoses:  Nonspecific chest pain    Rx / DC Orders ED Discharge Orders    None       Davonna Belling, MD 09/30/19 1352

## 2019-10-25 ENCOUNTER — Ambulatory Visit (INDEPENDENT_AMBULATORY_CARE_PROVIDER_SITE_OTHER): Payer: Medicare HMO | Admitting: Gastroenterology

## 2019-10-25 ENCOUNTER — Other Ambulatory Visit: Payer: Self-pay

## 2019-10-25 ENCOUNTER — Telehealth (INDEPENDENT_AMBULATORY_CARE_PROVIDER_SITE_OTHER): Payer: Self-pay | Admitting: *Deleted

## 2019-10-25 ENCOUNTER — Encounter (INDEPENDENT_AMBULATORY_CARE_PROVIDER_SITE_OTHER): Payer: Self-pay | Admitting: Gastroenterology

## 2019-10-25 ENCOUNTER — Other Ambulatory Visit (INDEPENDENT_AMBULATORY_CARE_PROVIDER_SITE_OTHER): Payer: Self-pay | Admitting: *Deleted

## 2019-10-25 ENCOUNTER — Encounter (INDEPENDENT_AMBULATORY_CARE_PROVIDER_SITE_OTHER): Payer: Self-pay | Admitting: *Deleted

## 2019-10-25 DIAGNOSIS — K625 Hemorrhage of anus and rectum: Secondary | ICD-10-CM | POA: Diagnosis not present

## 2019-10-25 MED ORDER — PLENVU 140 G PO SOLR: 1.0000 | Freq: Once | ORAL | 0 refills | Status: AC

## 2019-10-25 NOTE — Telephone Encounter (Signed)
Patient needs Plenvu (copay card) ° °

## 2019-10-25 NOTE — Progress Notes (Signed)
Maylon Peppers, M.D. Gastroenterology & Hepatology [facility] [address] [place], [place] [address] Primary Care Physician: Neale Burly, MD [place] [address]  Referring MD: PCP  I will communicate my assessment and recommendations to the referring MD via EMR. Note: Occasional unusual wording and randomly placed punctuation marks may result from the use of speech recognition technology to transcribe this document"  Chief Complaint:  rectal bleeding.  History of Present Illness: Sergio Alvarez is a 69 y.o. male with HTN, who presents for evaluation of rectal bleeding.  Patient reports that in June 2021 he had some episodes of fresh blood on top of his stool and when wiping. It resolved on its own after a few days but later recurred a month ago for the same amount of time. He never had similar symptoms in the past. He usually has a BM every day, no significant straining when having a BM.  The patient denies having any nausea, vomiting, fever, chills, melena, hematemesis, abdominal distention, abdominal pain, diarrhea, jaundice, pruritus or weight loss.  Last ZSW:FUXNATFT years ago, normal per patient Last Colonoscopy:2018 - submucosal lipoma per clinical notes, unclear location  FHx: neg for any gastrointestinal/liver disease, no malignancies Social: neg smoking, alcohol or illicit drug use Surgical: cholecystectomy  Past Medical History: Past Medical History:  Diagnosis Date  . Hypertension   . Ulcer     Past Surgical History: Past Surgical History:  Procedure Laterality Date  . BLADDER SURGERY      Family History: Family History  Problem Relation Age of Onset  . Diabetes Mother   . Diabetes Sister   . Diabetes Brother   . Diabetes Sister   . Diabetes Sister     Social History: Social History   Tobacco Use  Smoking Status Former Smoker  Smokeless Tobacco Never Used   Social History    Substance and Sexual Activity  Alcohol Use No   Social History   Substance and Sexual Activity  Drug Use No    Allergies: No Known Allergies  Medications: Current Outpatient Medications  Medication Sig Dispense Refill  . benazepril (LOTENSIN) 40 MG tablet Take 40 mg by mouth daily.    . chlorthalidone (HYGROTON) 25 MG tablet Take 25 mg by mouth daily.    . Cholecalciferol (VITAMIN D) 2000 UNITS CAPS Take 1 capsule by mouth daily.    . fish oil-omega-3 fatty acids 1000 MG capsule Take 1 g by mouth daily.    . potassium chloride SA (KLOR-CON) 20 MEQ tablet Take 20 mEq by mouth once.    . verapamil (VERELAN PM) 240 MG 24 hr capsule Take 240 mg by mouth at bedtime.     No current facility-administered medications for this visit.    Review of Systems: GENERAL: negative for malaise, night sweats HEENT: No changes in hearing or vision, no nose bleeds or other nasal problems. NECK: Negative for lumps, goiter, pain and significant neck swelling RESPIRATORY: Negative for cough, wheezing CARDIOVASCULAR: Negative for chest pain, leg swelling, palpitations, orthopnea GI: SEE HPI MUSCULOSKELETAL: Negative for joint pain or swelling, back pain, and muscle pain. SKIN: Negative for lesions, rash PSYCH: Negative for sleep disturbance, mood disorder and recent psychosocial stressors. HEMATOLOGY Negative for prolonged bleeding, bruising easily, and swollen nodes. ENDOCRINE: Negative for cold or heat intolerance, polyuria, polydipsia and goiter. NEURO: negative for tremor, gait imbalance, syncope and seizures. The remainder of the review of systems is noncontributory.   Physical Exam: BP Marland Kitchen)  161/81 (BP Location: Right Arm, Patient Position: Sitting, Cuff Size: Normal)   Pulse 87   Temp 98.5 F (36.9 C) (Oral)   Ht 6' (1.829 m)   Wt 234 lb 14.4 oz (106.5 kg)   BMI 31.86 kg/m  GENERAL: The patient is AO x3, in no acute distress. HEENT: Head is normocephalic and atraumatic. EOMI are  intact. Mouth is well hydrated and without lesions. NECK: Supple. No masses LUNGS: Clear to auscultation. No presence of rhonchi/wheezing/rales. Adequate chest expansion HEART: RRR, normal s1 and s2. ABDOMEN: Soft, nontender, no guarding, no peritoneal signs, and nondistended. BS +. No masses. EXTREMITIES: Without any cyanosis, clubbing, rash, lesions or edema. NEUROLOGIC: AOx3, no focal motor deficit. SKIN: no jaundice, no rashes   Imaging/Labs: as above  I personally reviewed and interpreted the available labs, imaging and endoscopic files.  Impression and Plan: Sergio Alvarez is a 69 y.o. male with HTN, who presents for evaluation of rectal bleeding.  The patient has presented a few episodes of rectal bleeding without presence of red flag signs.  He had a relatively recent colonoscopy that did not show any major microscopic alterations which will be concerning for malignancy.  I consider that possible etiologies for his painless rectal bleeding include hemorrhoids versus diverticular bleeding, also will need to consider possible AVM that developed in the last 3 years.  We will need to proceed with a colonoscopy to further evaluate these episodes.  Less likely related to fissure as he has not presented any pain or other rectal symptoms.  Patient understood and agreed.  - Schedule colonoscopy   All questions were answered.      Maylon Peppers, MD Gastroenterology and Hepatology [facility]

## 2019-10-25 NOTE — Patient Instructions (Addendum)
Schedule colonoscopy

## 2019-10-31 ENCOUNTER — Encounter (INDEPENDENT_AMBULATORY_CARE_PROVIDER_SITE_OTHER): Payer: Self-pay | Admitting: *Deleted

## 2019-10-31 ENCOUNTER — Telehealth (INDEPENDENT_AMBULATORY_CARE_PROVIDER_SITE_OTHER): Payer: Self-pay | Admitting: *Deleted

## 2019-10-31 MED ORDER — PEG 3350-KCL-NA BICARB-NACL 420 G PO SOLR: 4000.0000 mL | Freq: Once | ORAL | 0 refills | Status: AC

## 2019-10-31 NOTE — Telephone Encounter (Signed)
Patient needs trilyte 

## 2019-11-02 ENCOUNTER — Other Ambulatory Visit (INDEPENDENT_AMBULATORY_CARE_PROVIDER_SITE_OTHER): Payer: Self-pay | Admitting: *Deleted

## 2019-11-05 ENCOUNTER — Other Ambulatory Visit: Payer: Self-pay

## 2019-11-05 ENCOUNTER — Other Ambulatory Visit (HOSPITAL_COMMUNITY)
Admission: RE | Admit: 2019-11-05 | Discharge: 2019-11-05 | Disposition: A | Discharge status: Home / Self Care | Payer: Medicare HMO | Source: Ambulatory Visit | Attending: Gastroenterology | Admitting: Gastroenterology

## 2019-11-05 DIAGNOSIS — Z20822 Contact with and (suspected) exposure to covid-19: Secondary | ICD-10-CM | POA: Diagnosis not present

## 2019-11-05 DIAGNOSIS — Z01812 Encounter for preprocedural laboratory examination: Secondary | ICD-10-CM | POA: Insufficient documentation

## 2019-11-05 LAB — BASIC METABOLIC PANEL
Anion gap: 11 (ref 5–15)
BUN: 26 mg/dL — ABNORMAL HIGH (ref 8–23)
CO2: 25 mmol/L (ref 22–32)
Calcium: 9.9 mg/dL (ref 8.9–10.3)
Chloride: 101 mmol/L (ref 98–111)
Creatinine, Ser: 1.75 mg/dL — ABNORMAL HIGH (ref 0.61–1.24)
GFR calc Af Amer: 45 mL/min — ABNORMAL LOW (ref >60)
GFR calc non Af Amer: 39 mL/min — ABNORMAL LOW (ref >60)
Glucose, Bld: 134 mg/dL — ABNORMAL HIGH (ref 70–99)
Potassium: 4.3 mmol/L (ref 3.5–5.1)
Sodium: 137 mmol/L (ref 135–145)

## 2019-11-05 LAB — SARS CORONAVIRUS 2 (TAT 6-24 HRS): SARS Coronavirus 2: NEGATIVE

## 2019-11-06 ENCOUNTER — Ambulatory Visit (HOSPITAL_COMMUNITY): Payer: Medicare HMO | Admitting: Certified Registered"

## 2019-11-06 ENCOUNTER — Ambulatory Visit (HOSPITAL_COMMUNITY)
Admission: RE | Admit: 2019-11-06 | Discharge: 2019-11-06 | Disposition: A | Discharge status: Home / Self Care | Payer: Medicare HMO | Source: Ambulatory Visit | Attending: Gastroenterology | Admitting: Gastroenterology

## 2019-11-06 ENCOUNTER — Other Ambulatory Visit: Payer: Self-pay

## 2019-11-06 ENCOUNTER — Encounter (HOSPITAL_COMMUNITY)
Admission: RE | Disposition: A | Discharge status: Home / Self Care | Payer: Self-pay | Source: Ambulatory Visit | Attending: Gastroenterology

## 2019-11-06 ENCOUNTER — Encounter (HOSPITAL_COMMUNITY): Payer: Self-pay | Admitting: Gastroenterology

## 2019-11-06 DIAGNOSIS — Z79899 Other long term (current) drug therapy: Secondary | ICD-10-CM | POA: Insufficient documentation

## 2019-11-06 DIAGNOSIS — D175 Benign lipomatous neoplasm of intra-abdominal organs: Secondary | ICD-10-CM

## 2019-11-06 DIAGNOSIS — K648 Other hemorrhoids: Secondary | ICD-10-CM | POA: Diagnosis not present

## 2019-11-06 DIAGNOSIS — Z87891 Personal history of nicotine dependence: Secondary | ICD-10-CM | POA: Insufficient documentation

## 2019-11-06 DIAGNOSIS — K625 Hemorrhage of anus and rectum: Secondary | ICD-10-CM

## 2019-11-06 DIAGNOSIS — I10 Essential (primary) hypertension: Secondary | ICD-10-CM | POA: Diagnosis not present

## 2019-11-06 HISTORY — PX: COLONOSCOPY WITH PROPOFOL: SHX5780

## 2019-11-06 LAB — HM COLONOSCOPY

## 2019-11-06 SURGERY — COLONOSCOPY WITH PROPOFOL
Anesthesia: General

## 2019-11-06 MED ORDER — STERILE WATER FOR IRRIGATION IR SOLN
Status: DC | PRN
  Administered 2019-11-06: 100 mL

## 2019-11-06 MED ORDER — LACTATED RINGERS IV SOLN: INTRAVENOUS | Status: DC

## 2019-11-06 MED ORDER — PHENYLEPHRINE 40 MCG/ML (10ML) SYRINGE FOR IV PUSH (FOR BLOOD PRESSURE SUPPORT)
PREFILLED_SYRINGE | INTRAVENOUS | Status: DC | PRN
  Administered 2019-11-06 (×2): 80 ug via INTRAVENOUS

## 2019-11-06 MED ORDER — LIDOCAINE HCL (CARDIAC) PF 100 MG/5ML IV SOSY
PREFILLED_SYRINGE | INTRAVENOUS | Status: DC | PRN
  Administered 2019-11-06: 50 mg via INTRAVENOUS

## 2019-11-06 MED ORDER — PROPOFOL 500 MG/50ML IV EMUL
INTRAVENOUS | Status: DC | PRN
  Administered 2019-11-06: 100 ug/kg/min via INTRAVENOUS

## 2019-11-06 MED ORDER — LACTATED RINGERS IV SOLN: INTRAVENOUS | Status: DC | PRN

## 2019-11-06 MED ORDER — PROPOFOL 10 MG/ML IV BOLUS
INTRAVENOUS | Status: DC | PRN
  Administered 2019-11-06: 100 mg via INTRAVENOUS
  Administered 2019-11-06: 50 mg via INTRAVENOUS

## 2019-11-06 NOTE — Discharge Instructions (Signed)
You are being discharged to home.  Eat a high fiber diet.  Your physician has recommended a repeat colonoscopy in five years for screening purposes due to prep quality.   PATIENT INSTRUCTIONS POST-ANESTHESIA  IMMEDIATELY FOLLOWING SURGERY:  Do not drive or operate machinery for the first twenty four hours after surgery.  Do not make any important decisions for twenty four hours after surgery or while taking narcotic pain medications or sedatives.  If you develop intractable nausea and vomiting or a severe headache please notify your doctor immediately.  FOLLOW-UP:  Please make an appointment with your surgeon as instructed. You do not need to follow up with anesthesia unless specifically instructed to do so.  WOUND CARE INSTRUCTIONS (if applicable):  Keep a dry clean dressing on the anesthesia/puncture wound site if there is drainage.  Once the wound has quit draining you may leave it open to air.  Generally you should leave the bandage intact for twenty four hours unless there is drainage.  If the epidural site drains for more than 36-48 hours please call the anesthesia department.  QUESTIONS?:  Please feel free to call your physician or the hospital operator if you have any questions, and they will be happy to assist you.       Colonoscopy, Adult, Care After This sheet gives you information about how to care for yourself after your procedure. Your doctor may also give you more specific instructions. If you have problems or questions, call your doctor. What can I expect after the procedure? After the procedure, it is common to have:  A small amount of blood in your poop (stool) for 24 hours.  Some gas.  Mild cramping or bloating in your belly (abdomen). Follow these instructions at home: Eating and drinking   Drink enough fluid to keep your pee (urine) pale yellow.  Follow instructions from your doctor about what you cannot eat or drink.  Return to your normal diet as told by your  doctor. Avoid heavy or fried foods that are hard to digest. Activity  Rest as told by your doctor.  Do not sit for a long time without moving. Get up to take short walks every 1-2 hours. This is important. Ask for help if you feel weak or unsteady.  Return to your normal activities as told by your doctor. Ask your doctor what activities are safe for you. To help cramping and bloating:   Try walking around.  Put heat on your belly as told by your doctor. Use the heat source that your doctor recommends, such as a moist heat pack or a heating pad. ? Put a towel between your skin and the heat source. ? Leave the heat on for 20-30 minutes. ? Remove the heat if your skin turns bright red. This is very important if you are unable to feel pain, heat, or cold. You may have a greater risk of getting burned. General instructions  For the first 24 hours after the procedure: ? Do not drive or use machinery. ? Do not sign important documents. ? Do not drink alcohol. ? Do your daily activities more slowly than normal. ? Eat foods that are soft and easy to digest.  Take over-the-counter or prescription medicines only as told by your doctor.  Keep all follow-up visits as told by your doctor. This is important. Contact a doctor if:  You have blood in your poop 2-3 days after the procedure. Get help right away if:  You have more than a  small amount of blood in your poop.  You see large clumps of tissue (blood clots) in your poop.  Your belly is swollen.  You feel like you may vomit (nauseous).  You vomit.  You have a fever.  You have belly pain that gets worse, and medicine does not help your pain. Summary  After the procedure, it is common to have a small amount of blood in your poop. You may also have mild cramping and bloating in your belly.  For the first 24 hours after the procedure, do not drive or use machinery, do not sign important documents, and do not drink alcohol.  Get  help right away if you have a lot of blood in your poop, feel like you may vomit, have a fever, or have more belly pain. This information is not intended to replace advice given to you by your health care provider. Make sure you discuss any questions you have with your health care provider. Document Revised: 08/14/2018 Document Reviewed: 08/14/2018 Elsevier Patient Education  [place].    Hemorrhoids Hemorrhoids are swollen veins that may develop:  In the butt (rectum). These are called internal hemorrhoids.  Around the opening of the butt (anus). These are called external hemorrhoids. Hemorrhoids can cause pain, itching, or bleeding. Most of the time, they do not cause serious problems. They usually get better with diet changes, lifestyle changes, and other home treatments. What are the causes? This condition may be caused by:  Having trouble pooping (constipation).  Pushing hard (straining) to poop.  Watery poop (diarrhea).  Pregnancy.  Being very overweight (obese).  Sitting for long periods of time.  Heavy lifting or other activity that causes you to strain.  Anal sex.  Riding a bike for a long period of time. What are the signs or symptoms? Symptoms of this condition include:  Pain.  Itching or soreness in the butt.  Bleeding from the butt.  Leaking poop.  Swelling in the area.  One or more lumps around the opening of your butt. How is this diagnosed? A doctor can often diagnose this condition by looking at the affected area. The doctor may also:  Do an exam that involves feeling the area with a gloved hand (digital rectal exam).  Examine the area inside your butt using a small tube (anoscope).  Order blood tests. This may be done if you have lost a lot of blood.  Have you get a test that involves looking inside the colon using a flexible tube with a camera on the end (sigmoidoscopy or colonoscopy). How is this treated? This condition can  usually be treated at home. Your doctor may tell you to change what you eat, make lifestyle changes, or try home treatments. If these do not help, procedures can be done to remove the hemorrhoids or make them smaller. These may involve:  Placing rubber bands at the base of the hemorrhoids to cut off their blood supply.  Injecting medicine into the hemorrhoids to shrink them.  Shining a type of light energy onto the hemorrhoids to cause them to fall off.  Doing surgery to remove the hemorrhoids or cut off their blood supply. Follow these instructions at home: Eating and drinking   Eat foods that have a lot of fiber in them. These include whole grains, beans, nuts, fruits, and vegetables.  Ask your doctor about taking products that have added fiber (fibersupplements).  Reduce the amount of fat in your diet. You can do this by: ?  Eating low-fat dairy products. ? Eating less red meat. ? Avoiding processed foods.  Drink enough fluid to keep your pee (urine) pale yellow. Managing pain and swelling   Take a warm-water bath (sitz bath) for 20 minutes to ease pain. Do this 3-4 times a day. You may do this in a bathtub or using a portable sitz bath that fits over the toilet.  If told, put ice on the painful area. It may be helpful to use ice between your warm baths. ? Put ice in a plastic bag. ? Place a towel between your skin and the bag. ? Leave the ice on for 20 minutes, 2-3 times a day. General instructions  Take over-the-counter and prescription medicines only as told by your doctor. ? Medicated creams and medicines may be used as told.  Exercise often. Ask your doctor how much and what kind of exercise is best for you.  Go to the bathroom when you have the urge to poop. Do not wait.  Avoid pushing too hard when you poop.  Keep your butt dry and clean. Use wet toilet paper or moist towelettes after pooping.  Do not sit on the toilet for a long time.  Keep all follow-up  visits as told by your doctor. This is important. Contact a doctor if you:  Have pain and swelling that do not get better with treatment or medicine.  Have trouble pooping.  Cannot poop.  Have pain or swelling outside the area of the hemorrhoids. Get help right away if you have:  Bleeding that will not stop. Summary  Hemorrhoids are swollen veins in the butt or around the opening of the butt.  They can cause pain, itching, or bleeding.  Eat foods that have a lot of fiber in them. These include whole grains, beans, nuts, fruits, and vegetables.  Take a warm-water bath (sitz bath) for 20 minutes to ease pain. Do this 3-4 times a day. This information is not intended to replace advice given to you by your health care provider. Make sure you discuss any questions you have with your health care provider. Document Revised: 01/26/2018 Document Reviewed: 06/09/2017 Elsevier Patient Education  [place].    High-Fiber Diet Fiber, also called dietary fiber, is a type of carbohydrate that is found in fruits, vegetables, whole grains, and beans. A high-fiber diet can have many health benefits. Your health care provider may recommend a high-fiber diet to help:  Prevent constipation. Fiber can make your bowel movements more regular.  Lower your cholesterol.  Relieve the following conditions: ? Swelling of veins in the anus (hemorrhoids). ? Swelling and irritation (inflammation) of specific areas of the digestive tract (uncomplicated diverticulosis). ? A problem of the large intestine (colon) that sometimes causes pain and diarrhea (irritable bowel syndrome, IBS).  Prevent overeating as part of a weight-loss plan.  Prevent heart disease, type 2 diabetes, and certain cancers. What is my plan? The recommended daily fiber intake in grams (g) includes:  38 g for men age 38 or younger.  30 g for men over age 72.  59 g for women age 74 or younger.  21 g for women over age  47. You can get the recommended daily intake of dietary fiber by:  Eating a variety of fruits, vegetables, grains, and beans.  Taking a fiber supplement, if it is not possible to get enough fiber through your diet. What do I need to know about a high-fiber diet?  It is better to get  fiber through food sources rather than from fiber supplements. There is not a lot of research about how effective supplements are.  Always check the fiber content on the nutrition facts label of any prepackaged food. Look for foods that contain 5 g of fiber or more per serving.  Talk with a diet and nutrition specialist (dietitian) if you have questions about specific foods that are recommended or not recommended for your medical condition, especially if those foods are not listed below.  Gradually increase how much fiber you consume. If you increase your intake of dietary fiber too quickly, you may have bloating, cramping, or gas.  Drink plenty of water. Water helps you to digest fiber. What are tips for following this plan?  Eat a wide variety of high-fiber foods.  Make sure that half of the grains that you eat each day are whole grains.  Eat breads and cereals that are made with whole-grain flour instead of refined flour or white flour.  Eat brown rice, bulgur wheat, or millet instead of white rice.  Start the day with a breakfast that is high in fiber, such as a cereal that contains 5 g of fiber or more per serving.  Use beans in place of meat in soups, salads, and pasta dishes.  Eat high-fiber snacks, such as berries, raw vegetables, nuts, and popcorn.  Choose whole fruits and vegetables instead of processed forms like juice or sauce. What foods can I eat?  Fruits Berries. Pears. Apples. Oranges. Avocado. Prunes and raisins. Dried figs. Vegetables Sweet potatoes. Spinach. Kale. Artichokes. Cabbage. Broccoli. Cauliflower. Green peas. Carrots. Squash. Grains Whole-grain breads. Multigrain  cereal. Oats and oatmeal. Brown rice. Barley. Bulgur wheat. [place]. Quinoa. Bran muffins. Popcorn. Rye wafer crackers. Meats and other proteins Navy, kidney, and pinto beans. Soybeans. Split peas. Lentils. Nuts and seeds. Dairy Fiber-fortified yogurt. Beverages Fiber-fortified soy milk. Fiber-fortified orange juice. Other foods Fiber bars. The items listed above may not be a complete list of recommended foods and beverages. Contact a dietitian for more options. What foods are not recommended? Fruits Fruit juice. Cooked, strained fruit. Vegetables Fried potatoes. Canned vegetables. Well-cooked vegetables. Grains White bread. Pasta made with refined flour. White rice. Meats and other proteins Fatty cuts of meat. Fried chicken or fried fish. Dairy Milk. Yogurt. Cream cheese. Sour cream. Fats and oils Butters. Beverages Soft drinks. Other foods Cakes and pastries. The items listed above may not be a complete list of foods and beverages to avoid. Contact a dietitian for more information. Summary  Fiber is a type of carbohydrate. It is found in fruits, vegetables, whole grains, and beans.  There are many health benefits of eating a high-fiber diet, such as preventing constipation, lowering blood cholesterol, helping with weight loss, and reducing your risk of heart disease, diabetes, and certain cancers.  Gradually increase your intake of fiber. Increasing too fast can result in cramping, bloating, and gas. Drink plenty of water while you increase your fiber.  The best sources of fiber include whole fruits and vegetables, whole grains, nuts, seeds, and beans. This information is not intended to replace advice given to you by your health care provider. Make sure you discuss any questions you have with your health care provider. Document Revised: 11/22/2016 Document Reviewed: 11/22/2016 Elsevier Patient Education  2020 [employer].

## 2019-11-06 NOTE — Op Note (Signed)
[facility] Patient Name: Sergio Alvarez Procedure Date: 11/06/2019 7:12 AM MRN: [id number] Date of Birth: [birth year] Attending MD: Maylon Peppers ,  CSN: [id number] Age: 69 Admit Type: Outpatient Procedure:                Colonoscopy Indications:              Rectal bleeding Providers:                Maylon Peppers, Lambert Mody, Crystal Page,                            Casimer Bilis, Technician Referring MD:              Medicines:                Monitored Anesthesia Care Complications:            No immediate complications. Estimated Blood Loss:     Estimated blood loss: none. Procedure:                Pre-Anesthesia Assessment:                           - Prior to the procedure, a History and Physical                            was performed, and patient medications, allergies                            and sensitivities were reviewed. The patient's                            tolerance of previous anesthesia was reviewed.                           - The risks and benefits of the procedure and the                            sedation options and risks were discussed with the                            patient. All questions were answered and informed                            consent was obtained.                           - ASA Grade Assessment: II - A patient with mild                            systemic disease.                           After obtaining informed consent, the colonoscope                            was passed under direct vision. Throughout the  procedure, the patient's blood pressure, pulse, and                            oxygen saturations were monitored continuously. The                            [id number]-[id number] ([id number]) was introduced through the                            anus and advanced to the the cecum, identified by                            appendiceal orifice and ileocecal valve. The                             colonoscopy was performed without difficulty. The                            patient tolerated the procedure well. The quality                            of the bowel preparation was adequate to identify                            polyps 6 mm and larger in size. Scope withdrawal                            time was 10 minutes. Scope In: 8:07:27 AM Scope Out: 8:27:36 AM Scope Withdrawal Time: 0 hours 13 minutes 52 seconds  Total Procedure Duration: 0 hours 20 minutes 9 seconds  Findings:      The perianal and digital rectal examinations were normal.      There was a medium-sized lipoma, 12 mm in diameter, in the cecum.      Non-bleeding internal hemorrhoids were found during retroflexion. The       hemorrhoids were large. Impression:               - Medium-sized lipoma in the cecum.                           - Non-bleeding internal hemorrhoids.                           - No specimens collected. Moderate Sedation:      Per Anesthesia Care Recommendation:           - Discharge patient to home (ambulatory).                           - High fiber diet.                           - Repeat colonoscopy in 5 years for screening                            purposes due to prep quality. Procedure Code(s):        ---  Professional ---                           [phone], GC, Colonoscopy, flexible; diagnostic,                            including collection of specimen(s) by brushing or                            washing, when performed (separate procedure) Diagnosis Code(s):        --- Professional ---                           D17.5, Benign lipomatous neoplasm of                            intra-abdominal organs                           K64.8, Other hemorrhoids                           K62.5, Hemorrhage of anus and rectum CPT copyright 2019 American Medical Association. All rights reserved. The codes documented in this report are preliminary and upon coder review may  be revised to meet current  compliance requirements. Maylon Peppers, MD Maylon Peppers,  11/06/2019 8:39:13 AM This report has been signed electronically. Number of Addenda: 0

## 2019-11-06 NOTE — Anesthesia Procedure Notes (Signed)
Date/Time: 11/06/2019 8:10 AM Performed by: Orlie Dakin, CRNA Pre-anesthesia Checklist: Patient identified, Emergency Drugs available, Suction available and Patient being monitored Patient Re-evaluated:Patient Re-evaluated prior to induction Oxygen Delivery Method: Nasal cannula Induction Type: IV induction Placement Confirmation: positive ETCO2

## 2019-11-06 NOTE — Anesthesia Preprocedure Evaluation (Signed)
Anesthesia Evaluation  Patient identified by MRN, date of birth, ID band Patient awake    Reviewed: Allergy & Precautions, H&P , NPO status , Patient's Chart, lab work & pertinent test results, reviewed documented beta blocker date and time   Airway Mallampati: II  TM Distance: >3 FB Neck ROM: full    Dental no notable dental hx.    Pulmonary neg pulmonary ROS, former smoker,    Pulmonary exam normal breath sounds clear to auscultation       Cardiovascular Exercise Tolerance: Good hypertension, negative cardio ROS   Rhythm:regular Rate:Normal     Neuro/Psych negative neurological ROS  negative psych ROS   GI/Hepatic negative GI ROS, Neg liver ROS,   Endo/Other  negative endocrine ROS  Renal/GU negative Renal ROS  negative genitourinary   Musculoskeletal   Abdominal   Peds  Hematology negative hematology ROS (+)   Anesthesia Other Findings   Reproductive/Obstetrics negative OB ROS                             Anesthesia Physical Anesthesia Plan  ASA: II  Anesthesia Plan: General   Post-op Pain Management:    Induction:   PONV Risk Score and Plan: Propofol infusion  Airway Management Planned:   Additional Equipment:   Intra-op Plan:   Post-operative Plan:   Informed Consent: I have reviewed the patients History and Physical, chart, labs and discussed the procedure including the risks, benefits and alternatives for the proposed anesthesia with the patient or authorized representative who has indicated his/her understanding and acceptance.     Dental Advisory Given  Plan Discussed with: CRNA  Anesthesia Plan Comments:         Anesthesia Quick Evaluation  

## 2019-11-06 NOTE — Transfer of Care (Signed)
Immediate Anesthesia Transfer of Care Note  Patient: Sergio Alvarez  Procedure(s) Performed: COLONOSCOPY WITH PROPOFOL (N/A )  Patient Location: Endoscopy Unit  Anesthesia Type:General  Level of Consciousness: drowsy  Airway & Oxygen Therapy: Patient Spontanous Breathing  Post-op Assessment: Report given to RN and Post -op Vital signs reviewed and stable  Post vital signs: Reviewed and stable  Last Vitals:  Vitals Value Taken Time  BP    Temp    Pulse    Resp    SpO2      Last Pain:  Vitals:   11/06/19 0803  TempSrc:   PainSc: 0-No pain         Complications: No complications documented.

## 2019-11-06 NOTE — Anesthesia Postprocedure Evaluation (Signed)
Anesthesia Post Note  Patient: Sergio Alvarez  Procedure(s) Performed: COLONOSCOPY WITH PROPOFOL (N/A )  Patient location during evaluation: Endoscopy Anesthesia Type: General Level of consciousness: oriented and awake and alert Vital Signs Assessment: post-procedure vital signs reviewed and stable Respiratory status: nonlabored ventilation, respiratory function stable and spontaneous breathing Cardiovascular status: blood pressure returned to baseline Postop Assessment: no apparent nausea or vomiting Anesthetic complications: no   No complications documented.   Last Vitals:  Vitals:   11/06/19 0654 11/06/19 0831  BP: (!) 163/75 109/71  Pulse: 97 97  Resp: 16 15  Temp: 36.8 C 36.8 C  SpO2: 100% 97%    Last Pain:  Vitals:   11/06/19 0831  TempSrc: Oral  PainSc: 0-No pain                 Orlie Dakin

## 2019-11-06 NOTE — H&P (Signed)
Sergio Alvarez is an 69 y.o. male.   Chief Complaint: Rectal bleeding HPI: 69 year old male with past medical history of HTN, who comes to the hospital for evaluation of rectal bleeding.  The patient reported intermittent episodes of rectal bleeding 4 months ago, usually when he was wiping but also on top of his stool.  He had recurrent symptoms a month ago which made him concerned.  He denies having any other complaints. The patient denies having any nausea, vomiting, fever, chills, melena, hematemesis, abdominal distention, abdominal pain, diarrhea, jaundice, pruritus or weight loss.  Patient had his last colonoscopy in 2018, was found to have a submucosal lipoma.  No family history of malignancies.   Past Medical History:  Diagnosis Date  . Hypertension   . Ulcer     Past Surgical History:  Procedure Laterality Date  . BLADDER SURGERY      Family History  Problem Relation Age of Onset  . Diabetes Mother   . Diabetes Sister   . Diabetes Brother   . Diabetes Sister   . Diabetes Sister    Social History:  reports that he has quit smoking. He has never used smokeless tobacco. He reports that he does not drink alcohol and does not use drugs.  Allergies: No Known Allergies  Medications Prior to Admission  Medication Sig Dispense Refill  . acetaminophen (TYLENOL) 500 MG tablet Take 500 mg by mouth every 6 (six) hours as needed for moderate pain or headache.    . benazepril (LOTENSIN) 40 MG tablet Take 40 mg by mouth daily.    . chlorthalidone (HYGROTON) 50 MG tablet Take 50 mg by mouth daily.    . cholecalciferol (VITAMIN D3) 25 MCG (1000 UNIT) tablet Take 1,000 Units by mouth daily.    . fish oil-omega-3 fatty acids 1000 MG capsule Take 1 g by mouth daily.    . potassium chloride SA (KLOR-CON) 20 MEQ tablet Take 20 mEq by mouth daily.     . verapamil (CALAN-SR) 240 MG CR tablet Take 240 mg by mouth daily.      Results for orders placed or performed during the hospital encounter  of 11/05/19 (from the past 48 hour(s))  SARS CORONAVIRUS 2 (TAT 6-24 HRS) Nasopharyngeal Nasopharyngeal Swab     Status: None   Collection Time: 11/05/19  8:20 AM   Specimen: Nasopharyngeal Swab  Result Value Ref Range   SARS Coronavirus 2 NEGATIVE NEGATIVE    Comment: (NOTE) SARS-CoV-2 target nucleic acids are NOT DETECTED.  The SARS-CoV-2 RNA is generally detectable in upper and lower respiratory specimens during the acute phase of infection. Negative results do not preclude SARS-CoV-2 infection, do not rule out co-infections with other pathogens, and should not be used as the sole basis for treatment or other patient management decisions. Negative results must be combined with clinical observations, patient history, and epidemiological information. The expected result is Negative.  Fact Sheet for Patients: SugarRoll.be  Fact Sheet for Healthcare Providers: https://www.woods-mathews.com/  This test is not yet approved or cleared by the [place] FDA and  has been authorized for detection and/or diagnosis of SARS-CoV-2 by FDA under an Emergency Use Authorization (EUA). This EUA will remain  in effect (meaning this test can be used) for the duration of the COVID-19 declaration under Se ction 564(b)(1) of the Act, 21 U.S.C. section 360bbb-3(b)(1), unless the authorization is terminated or revoked sooner.  Performed at [facility], [place] [address], [place], [place] [address]   Basic metabolic panel  Status: Abnormal   Collection Time: 11/05/19  9:27 AM  Result Value Ref Range   Sodium 137 135 - 145 mmol/L   Potassium 4.3 3.5 - 5.1 mmol/L   Chloride 101 98 - 111 mmol/L   CO2 25 22 - 32 mmol/L   Glucose, Bld 134 (H) 70 - 99 mg/dL    Comment: Glucose reference range applies only to samples taken after fasting for at least 8 hours.   BUN 26 (H) 8 - 23 mg/dL   Creatinine, Ser 1.75 (H) 0.61 - 1.24 mg/dL   Calcium 9.9 8.9 -  10.3 mg/dL   GFR calc non Af Amer 39 (L) >60 mL/min   GFR calc Af Amer 45 (L) >60 mL/min   Anion gap 11 5 - 15    Comment: Performed at [facility], [address] [place], [place] [address]   No results found.  Review of Systems  Constitutional: Negative.   HENT: Negative.   Eyes: Negative.   Respiratory: Negative.   Cardiovascular: Negative.   Gastrointestinal: Positive for anal bleeding.  Endocrine: Negative.   Genitourinary: Negative.   Musculoskeletal: Negative.   Skin: Negative.   Allergic/Immunologic: Negative.   Neurological: Negative.   Hematological: Negative.   Psychiatric/Behavioral: Negative.     Blood pressure (!) 163/75, pulse 97, temperature 98.3 F (36.8 C), temperature source Oral, resp. rate 16, height 6' (1.829 m), SpO2 100 %. Physical Exam  GENERAL: The patient is AO x3, in no acute distress. HEENT: Head is normocephalic and atraumatic. EOMI are intact. Mouth is well hydrated and without lesions. NECK: Supple. No masses LUNGS: Clear to auscultation. No presence of rhonchi/wheezing/rales. Adequate chest expansion HEART: RRR, normal s1 and s2. ABDOMEN: Soft, nontender, no guarding, no peritoneal signs, and nondistended. BS +. No masses. EXTREMITIES: Without any cyanosis, clubbing, rash, lesions or edema. NEUROLOGIC: AOx3, no focal motor deficit. SKIN: no jaundice, no rashes  Assessment/Plan 69 year old male with past medical history of HTN, who comes to the hospital for evaluation of rectal bleeding.  We will proceed with a colonoscopy today.  Harvel Quale, MD 11/06/2019, 8:00 AM

## 2019-11-07 ENCOUNTER — Encounter (INDEPENDENT_AMBULATORY_CARE_PROVIDER_SITE_OTHER): Payer: Self-pay | Admitting: *Deleted

## 2019-11-12 ENCOUNTER — Encounter (HOSPITAL_COMMUNITY): Payer: Self-pay | Admitting: Gastroenterology

## 2019-11-19 DIAGNOSIS — Z23 Encounter for immunization: Secondary | ICD-10-CM | POA: Diagnosis not present

## 2019-12-12 DIAGNOSIS — I10 Essential (primary) hypertension: Secondary | ICD-10-CM | POA: Diagnosis not present

## 2019-12-12 DIAGNOSIS — E7849 Other hyperlipidemia: Secondary | ICD-10-CM | POA: Diagnosis not present

## 2019-12-12 DIAGNOSIS — G603 Idiopathic progressive neuropathy: Secondary | ICD-10-CM | POA: Diagnosis not present

## 2019-12-12 DIAGNOSIS — Z6833 Body mass index (BMI) 33.0-33.9, adult: Secondary | ICD-10-CM | POA: Diagnosis not present

## 2019-12-12 DIAGNOSIS — N182 Chronic kidney disease, stage 2 (mild): Secondary | ICD-10-CM | POA: Diagnosis not present

## 2019-12-12 DIAGNOSIS — Z1331 Encounter for screening for depression: Secondary | ICD-10-CM | POA: Diagnosis not present

## 2019-12-12 DIAGNOSIS — M545 Low back pain, unspecified: Secondary | ICD-10-CM | POA: Diagnosis not present

## 2019-12-12 DIAGNOSIS — J3089 Other allergic rhinitis: Secondary | ICD-10-CM | POA: Diagnosis not present

## 2019-12-12 DIAGNOSIS — Z0000 Encounter for general adult medical examination without abnormal findings: Secondary | ICD-10-CM | POA: Diagnosis not present

## 2020-02-11 DIAGNOSIS — Z13820 Encounter for screening for osteoporosis: Secondary | ICD-10-CM | POA: Diagnosis not present

## 2020-02-11 DIAGNOSIS — M810 Age-related osteoporosis without current pathological fracture: Secondary | ICD-10-CM | POA: Diagnosis not present

## 2020-03-12 DIAGNOSIS — R7303 Prediabetes: Secondary | ICD-10-CM | POA: Diagnosis not present

## 2020-03-12 DIAGNOSIS — J3089 Other allergic rhinitis: Secondary | ICD-10-CM | POA: Diagnosis not present

## 2020-03-12 DIAGNOSIS — N182 Chronic kidney disease, stage 2 (mild): Secondary | ICD-10-CM | POA: Diagnosis not present

## 2020-03-12 DIAGNOSIS — E7849 Other hyperlipidemia: Secondary | ICD-10-CM | POA: Diagnosis not present

## 2020-03-12 DIAGNOSIS — G603 Idiopathic progressive neuropathy: Secondary | ICD-10-CM | POA: Diagnosis not present

## 2020-03-12 DIAGNOSIS — M545 Low back pain, unspecified: Secondary | ICD-10-CM | POA: Diagnosis not present

## 2020-03-12 DIAGNOSIS — Z1331 Encounter for screening for depression: Secondary | ICD-10-CM | POA: Diagnosis not present

## 2020-03-12 DIAGNOSIS — Z6833 Body mass index (BMI) 33.0-33.9, adult: Secondary | ICD-10-CM | POA: Diagnosis not present

## 2020-03-12 DIAGNOSIS — Z0000 Encounter for general adult medical examination without abnormal findings: Secondary | ICD-10-CM | POA: Diagnosis not present

## 2020-03-12 DIAGNOSIS — I10 Essential (primary) hypertension: Secondary | ICD-10-CM | POA: Diagnosis not present

## 2020-04-30 DIAGNOSIS — N182 Chronic kidney disease, stage 2 (mild): Secondary | ICD-10-CM | POA: Diagnosis not present

## 2020-04-30 DIAGNOSIS — M545 Low back pain, unspecified: Secondary | ICD-10-CM | POA: Diagnosis not present

## 2020-04-30 DIAGNOSIS — G603 Idiopathic progressive neuropathy: Secondary | ICD-10-CM | POA: Diagnosis not present

## 2020-04-30 DIAGNOSIS — I10 Essential (primary) hypertension: Secondary | ICD-10-CM | POA: Diagnosis not present

## 2020-04-30 DIAGNOSIS — E7849 Other hyperlipidemia: Secondary | ICD-10-CM | POA: Diagnosis not present

## 2020-05-31 DIAGNOSIS — M545 Low back pain, unspecified: Secondary | ICD-10-CM | POA: Diagnosis not present

## 2020-05-31 DIAGNOSIS — E7849 Other hyperlipidemia: Secondary | ICD-10-CM | POA: Diagnosis not present

## 2020-05-31 DIAGNOSIS — N182 Chronic kidney disease, stage 2 (mild): Secondary | ICD-10-CM | POA: Diagnosis not present

## 2020-05-31 DIAGNOSIS — I10 Essential (primary) hypertension: Secondary | ICD-10-CM | POA: Diagnosis not present

## 2020-06-11 DIAGNOSIS — I10 Essential (primary) hypertension: Secondary | ICD-10-CM | POA: Diagnosis not present

## 2020-06-11 DIAGNOSIS — M545 Low back pain, unspecified: Secondary | ICD-10-CM | POA: Diagnosis not present

## 2020-06-11 DIAGNOSIS — Z6833 Body mass index (BMI) 33.0-33.9, adult: Secondary | ICD-10-CM | POA: Diagnosis not present

## 2020-06-11 DIAGNOSIS — N182 Chronic kidney disease, stage 2 (mild): Secondary | ICD-10-CM | POA: Diagnosis not present

## 2020-06-11 DIAGNOSIS — E7849 Other hyperlipidemia: Secondary | ICD-10-CM | POA: Diagnosis not present

## 2020-06-11 DIAGNOSIS — J3089 Other allergic rhinitis: Secondary | ICD-10-CM | POA: Diagnosis not present

## 2020-06-11 DIAGNOSIS — G603 Idiopathic progressive neuropathy: Secondary | ICD-10-CM | POA: Diagnosis not present

## 2020-07-31 DIAGNOSIS — N182 Chronic kidney disease, stage 2 (mild): Secondary | ICD-10-CM | POA: Diagnosis not present

## 2020-07-31 DIAGNOSIS — M545 Low back pain, unspecified: Secondary | ICD-10-CM | POA: Diagnosis not present

## 2020-07-31 DIAGNOSIS — E7849 Other hyperlipidemia: Secondary | ICD-10-CM | POA: Diagnosis not present

## 2020-07-31 DIAGNOSIS — I10 Essential (primary) hypertension: Secondary | ICD-10-CM | POA: Diagnosis not present

## 2020-08-31 DIAGNOSIS — M545 Low back pain, unspecified: Secondary | ICD-10-CM | POA: Diagnosis not present

## 2020-08-31 DIAGNOSIS — E7849 Other hyperlipidemia: Secondary | ICD-10-CM | POA: Diagnosis not present

## 2020-08-31 DIAGNOSIS — I10 Essential (primary) hypertension: Secondary | ICD-10-CM | POA: Diagnosis not present

## 2020-08-31 DIAGNOSIS — N182 Chronic kidney disease, stage 2 (mild): Secondary | ICD-10-CM | POA: Diagnosis not present

## 2020-09-08 ENCOUNTER — Emergency Department (HOSPITAL_COMMUNITY): Payer: Medicare HMO

## 2020-09-08 ENCOUNTER — Emergency Department (HOSPITAL_COMMUNITY)
Admission: EM | Admit: 2020-09-08 | Discharge: 2020-09-08 | Disposition: A | Discharge status: Home / Self Care | Payer: Medicare HMO | Attending: Emergency Medicine | Admitting: Emergency Medicine

## 2020-09-08 ENCOUNTER — Other Ambulatory Visit: Payer: Self-pay

## 2020-09-08 ENCOUNTER — Encounter (HOSPITAL_COMMUNITY): Payer: Self-pay | Admitting: *Deleted

## 2020-09-08 DIAGNOSIS — Z79899 Other long term (current) drug therapy: Secondary | ICD-10-CM | POA: Diagnosis not present

## 2020-09-08 DIAGNOSIS — I10 Essential (primary) hypertension: Secondary | ICD-10-CM | POA: Diagnosis not present

## 2020-09-08 DIAGNOSIS — Z87891 Personal history of nicotine dependence: Secondary | ICD-10-CM | POA: Insufficient documentation

## 2020-09-08 DIAGNOSIS — R079 Chest pain, unspecified: Secondary | ICD-10-CM | POA: Diagnosis not present

## 2020-09-08 DIAGNOSIS — R0789 Other chest pain: Secondary | ICD-10-CM | POA: Diagnosis not present

## 2020-09-08 LAB — COMPREHENSIVE METABOLIC PANEL
ALT: 26 U/L (ref 0–44)
AST: 20 U/L (ref 15–41)
Albumin: 4.5 g/dL (ref 3.5–5.0)
Alkaline Phosphatase: 71 U/L (ref 38–126)
Anion gap: 8 (ref 5–15)
BUN: 27 mg/dL — ABNORMAL HIGH (ref 8–23)
CO2: 27 mmol/L (ref 22–32)
Calcium: 9.7 mg/dL (ref 8.9–10.3)
Chloride: 101 mmol/L (ref 98–111)
Creatinine, Ser: 1.59 mg/dL — ABNORMAL HIGH (ref 0.61–1.24)
GFR, Estimated: 47 mL/min — ABNORMAL LOW (ref >60)
Glucose, Bld: 160 mg/dL — ABNORMAL HIGH (ref 70–99)
Potassium: 3.8 mmol/L (ref 3.5–5.1)
Sodium: 136 mmol/L (ref 135–145)
Total Bilirubin: 0.8 mg/dL (ref 0.3–1.2)
Total Protein: 8.1 g/dL (ref 6.5–8.1)

## 2020-09-08 LAB — CBC
HCT: 37.5 % — ABNORMAL LOW (ref 39.0–52.0)
Hemoglobin: 12.6 g/dL — ABNORMAL LOW (ref 13.0–17.0)
MCH: 28.1 pg (ref 26.0–34.0)
MCHC: 33.6 g/dL (ref 30.0–36.0)
MCV: 83.5 fL (ref 80.0–100.0)
Platelets: 217 10*3/uL (ref 150–400)
RBC: 4.49 MIL/uL (ref 4.22–5.81)
RDW: 14.5 % (ref 11.5–15.5)
WBC: 7 10*3/uL (ref 4.0–10.5)
nRBC: 0 % (ref 0.0–0.2)

## 2020-09-08 LAB — LIPASE, BLOOD: Lipase: 25 U/L (ref 11–51)

## 2020-09-08 LAB — TROPONIN I (HIGH SENSITIVITY)
Troponin I (High Sensitivity): 3 ng/L (ref <18)
Troponin I (High Sensitivity): 3 ng/L (ref <18)

## 2020-09-08 NOTE — Discharge Instructions (Addendum)
Call your primary care doctor or specialist as discussed in the next 2-3 days.   Return immediately back to the ER if:  Your symptoms worsen within the next 12-24 hours. You develop new symptoms such as new fevers, persistent vomiting, new pain, shortness of breath, or new weakness or numbness, or if you have any other concerns.  

## 2020-09-08 NOTE — ED Triage Notes (Signed)
Pt c/o waking up this morning feeling clammy with burning in his chest. Denies SOB, nausea, dizziness.

## 2020-09-08 NOTE — ED Notes (Signed)
Pt reports woke up around 4am  Feeling hot and diaphoretic.  Reports burning sensation in chest and body felt "uncomfortable."  Pt presently denies any symptoms.  EDP at bedside.

## 2020-09-08 NOTE — ED Provider Notes (Signed)
[facility] EMERGENCY DEPARTMENT Provider Note   CSN: [id number] Arrival date & time: 09/08/20  [id number]     History Chief Complaint  Patient presents with   Chest Pain    Sergio Alvarez is a 70 y.o. male.  Patient complaining of chest pain.  He states that it woke him up around 4 AM in the morning, describes a burning sensation in his chest.  He states that symptoms lasted for about 2 minutes, otherwise none radiating.  Symptoms have since resolved and he has no discomfort at this time.  No prior cardiac history per patient.  He does states he has a history of ulcers and is not sure if this is the cause of today's pain.  Otherwise denies any fevers or cough or vomiting or diarrhea.  No abdominal pain reported.      Past Medical History:  Diagnosis Date   Hypertension    Ulcer     Patient Active Problem List   Diagnosis Date Noted   Rectal bleeding 10/25/2019    Past Surgical History:  Procedure Laterality Date   BLADDER SURGERY     COLONOSCOPY WITH PROPOFOL N/A 11/06/2019   Procedure: COLONOSCOPY WITH PROPOFOL;  Surgeon: Harvel Quale, MD;  Location: [facility] ENDO [facility];  Service: Gastroenterology;  Laterality: N/A;  24       Family History  Problem Relation Age of Onset   Diabetes Mother    Diabetes Sister    Diabetes Brother    Diabetes Sister    Diabetes Sister     Social History   Tobacco Use   Smoking status: Former   Smokeless tobacco: Never  Scientific laboratory technician Use: Never used  Substance Use Topics   Alcohol use: No   Drug use: No    Home Medications Prior to Admission medications   Medication Sig Start Date End Date Taking? Authorizing Provider  acetaminophen (TYLENOL) 500 MG tablet Take 500 mg by mouth every 6 (six) hours as needed for moderate pain or headache.    [provider]  benazepril (LOTENSIN) 40 MG tablet Take 40 mg by mouth daily.    [provider]  chlorthalidone (HYGROTON) 50 MG tablet Take 50 mg by mouth  daily. 10/18/19   [provider]  cholecalciferol (VITAMIN D3) 25 MCG (1000 UNIT) tablet Take 1,000 Units by mouth daily.    [provider]  fish oil-omega-3 fatty acids 1000 MG capsule Take 1 g by mouth daily.    [provider]  potassium chloride SA (KLOR-CON) 20 MEQ tablet Take 20 mEq by mouth daily.     [provider]  verapamil (CALAN-SR) 240 MG CR tablet Take 240 mg by mouth daily.    [provider]    Allergies    Patient has no known allergies.  Review of Systems   Review of Systems  Constitutional:  Negative for fever.  HENT:  Negative for ear pain and sore throat.   Eyes:  Negative for pain.  Respiratory:  Negative for cough.   Cardiovascular:  Positive for chest pain.  Gastrointestinal:  Negative for abdominal pain.  Genitourinary:  Negative for flank pain.  Musculoskeletal:  Negative for back pain.  Skin:  Negative for color change and rash.  Neurological:  Negative for syncope.  All other systems reviewed and are negative.  Physical Exam Updated Vital Signs BP (!) 153/75   Pulse 70   Temp 98.3 F (36.8 C) (Oral)   Resp 15  Ht 6' (1.829 m)   Wt 108.9 kg   SpO2 100%   BMI 32.55 kg/m   Physical Exam Constitutional:      Appearance: He is well-developed.  HENT:     Head: Normocephalic.     Nose: Nose normal.  Eyes:     Extraocular Movements: Extraocular movements intact.  Cardiovascular:     Rate and Rhythm: Normal rate.  Pulmonary:     Effort: Pulmonary effort is normal.  Skin:    Coloration: Skin is not jaundiced.  Neurological:     Mental Status: He is alert. Mental status is at baseline.    ED Results / Procedures / Treatments   Labs (all labs ordered are listed, but only abnormal results are displayed) Labs Reviewed  CBC - Abnormal; Notable for the following components:      Result Value   Hemoglobin 12.6 (*)    HCT 37.5 (*)    All other components within normal limits  COMPREHENSIVE  METABOLIC PANEL - Abnormal; Notable for the following components:   Glucose, Bld 160 (*)    BUN 27 (*)    Creatinine, Ser 1.59 (*)    GFR, Estimated 47 (*)    All other components within normal limits  LIPASE, BLOOD  TROPONIN I (HIGH SENSITIVITY)  TROPONIN I (HIGH SENSITIVITY)    EKG EKG Interpretation  Date/Time:  Monday September 08 2020 07:26:34 EDT Ventricular Rate:  81 PR Interval:  178 QRS Duration: 94 QT Interval:  374 QTC Calculation: 434 R Axis:   -16 Text Interpretation: Normal sinus rhythm Moderate voltage criteria for LVH, may be normal variant ( R in aVL , Cornell product ) Borderline ECG Confirmed by Thamas Jaegers ([id number]) on 09/08/2020 9:05:27 AM  Radiology DG Chest 2 View  Result Date: 09/08/2020 CLINICAL DATA:  Chest pain. EXAM: CHEST - 2 VIEW COMPARISON:  09/30/2019 FINDINGS: Both lungs are clear. Again noted is an azygos lobe. Heart and mediastinum are within normal limits and stable. No pleural effusions. Degenerative changes in the upper and lower thoracic spine. IMPRESSION: No active cardiopulmonary disease. Electronically Signed   By: Markus Daft M.D.   On: 09/08/2020 07:50    Procedures Procedures   Medications Ordered in ED Medications - No data to display  ED Course  I have reviewed the triage vital signs and the nursing notes.  Pertinent labs & imaging results that were available during my care of the patient were reviewed by me and considered in my medical decision making (see chart for details).    MDM Rules/Calculators/A&P                           Patient presents well-appearing in no distress with normal vitals.  Initial EKG shows sinus rhythm no ST elevations or depressions normal rate normal rhythm.  Initial labs unremarkable troponin is negative.  We will follow serial troponins, if they continue to be normal will discharge home with instructions to follow-up with cardiology on an outpatient basis.  Final Clinical Impression(s) / ED  Diagnoses Final diagnoses:  Nonspecific chest pain    Rx / DC Orders ED Discharge Orders     None        Luna Fuse, MD 09/08/20 1022

## 2020-09-11 DIAGNOSIS — Z6833 Body mass index (BMI) 33.0-33.9, adult: Secondary | ICD-10-CM | POA: Diagnosis not present

## 2020-09-11 DIAGNOSIS — I10 Essential (primary) hypertension: Secondary | ICD-10-CM | POA: Diagnosis not present

## 2020-09-11 DIAGNOSIS — E7849 Other hyperlipidemia: Secondary | ICD-10-CM | POA: Diagnosis not present

## 2020-09-11 DIAGNOSIS — Z0000 Encounter for general adult medical examination without abnormal findings: Secondary | ICD-10-CM | POA: Diagnosis not present

## 2020-09-11 DIAGNOSIS — Z1159 Encounter for screening for other viral diseases: Secondary | ICD-10-CM | POA: Diagnosis not present

## 2020-09-11 DIAGNOSIS — N182 Chronic kidney disease, stage 2 (mild): Secondary | ICD-10-CM | POA: Diagnosis not present

## 2020-09-11 DIAGNOSIS — M545 Low back pain, unspecified: Secondary | ICD-10-CM | POA: Diagnosis not present

## 2020-09-11 DIAGNOSIS — Z125 Encounter for screening for malignant neoplasm of prostate: Secondary | ICD-10-CM | POA: Diagnosis not present

## 2020-09-11 DIAGNOSIS — J3089 Other allergic rhinitis: Secondary | ICD-10-CM | POA: Diagnosis not present

## 2020-09-11 DIAGNOSIS — G603 Idiopathic progressive neuropathy: Secondary | ICD-10-CM | POA: Diagnosis not present

## 2020-09-11 DIAGNOSIS — R7303 Prediabetes: Secondary | ICD-10-CM | POA: Diagnosis not present

## 2020-09-23 ENCOUNTER — Encounter (INDEPENDENT_AMBULATORY_CARE_PROVIDER_SITE_OTHER): Payer: Self-pay | Admitting: *Deleted

## 2020-10-01 DIAGNOSIS — E7849 Other hyperlipidemia: Secondary | ICD-10-CM | POA: Diagnosis not present

## 2020-10-01 DIAGNOSIS — I10 Essential (primary) hypertension: Secondary | ICD-10-CM | POA: Diagnosis not present

## 2020-10-01 DIAGNOSIS — N182 Chronic kidney disease, stage 2 (mild): Secondary | ICD-10-CM | POA: Diagnosis not present

## 2020-11-10 DIAGNOSIS — Z23 Encounter for immunization: Secondary | ICD-10-CM | POA: Diagnosis not present

## 2020-12-18 DIAGNOSIS — G603 Idiopathic progressive neuropathy: Secondary | ICD-10-CM | POA: Diagnosis not present

## 2020-12-18 DIAGNOSIS — Z6834 Body mass index (BMI) 34.0-34.9, adult: Secondary | ICD-10-CM | POA: Diagnosis not present

## 2020-12-18 DIAGNOSIS — N182 Chronic kidney disease, stage 2 (mild): Secondary | ICD-10-CM | POA: Diagnosis not present

## 2020-12-18 DIAGNOSIS — Z0000 Encounter for general adult medical examination without abnormal findings: Secondary | ICD-10-CM | POA: Diagnosis not present

## 2020-12-18 DIAGNOSIS — E7849 Other hyperlipidemia: Secondary | ICD-10-CM | POA: Diagnosis not present

## 2020-12-18 DIAGNOSIS — M545 Low back pain, unspecified: Secondary | ICD-10-CM | POA: Diagnosis not present

## 2020-12-18 DIAGNOSIS — I10 Essential (primary) hypertension: Secondary | ICD-10-CM | POA: Diagnosis not present

## 2020-12-18 DIAGNOSIS — J3089 Other allergic rhinitis: Secondary | ICD-10-CM | POA: Diagnosis not present

## 2020-12-18 DIAGNOSIS — Z1331 Encounter for screening for depression: Secondary | ICD-10-CM | POA: Diagnosis not present

## 2020-12-22 ENCOUNTER — Ambulatory Visit (INDEPENDENT_AMBULATORY_CARE_PROVIDER_SITE_OTHER): Payer: Medicare HMO | Admitting: Gastroenterology

## 2020-12-22 ENCOUNTER — Other Ambulatory Visit: Payer: Self-pay

## 2020-12-22 ENCOUNTER — Encounter (INDEPENDENT_AMBULATORY_CARE_PROVIDER_SITE_OTHER): Payer: Self-pay | Admitting: Gastroenterology

## 2020-12-22 DIAGNOSIS — R768 Other specified abnormal immunological findings in serum: Secondary | ICD-10-CM | POA: Diagnosis not present

## 2020-12-22 DIAGNOSIS — R7689 Other specified abnormal immunological findings in serum: Secondary | ICD-10-CM

## 2020-12-22 NOTE — Progress Notes (Signed)
Sergio Alvarez, M.D. Gastroenterology & Hepatology [facility] [address] [place], [place] [address]  Primary Care Physician: Neale Burly, MD [place] [address]  I will communicate my assessment and recommendations to the referring MD via EMR.  Problems: Evaluation for hepatitis C testing  History of Present Illness: Sergio Alvarez is a 70 y.o. male with past medical history of hypertension, who presents for evaluation of possible positive hepatitis C testing.  The patient was last seen on 10/25/2019. At that time, the patient was being evaluated for episode of rectal bleeding for which he was scheduled for a colonoscopy with findings described below.  Since then, his rectal bleeding has subsided.  The patient states that he has felt well and denies having any complaints.The patient denies having any nausea, vomiting, fever, chills, hematochezia, melena, hematemesis, abdominal distention, abdominal pain, diarrhea, jaundice, pruritus or weight loss.  I received the results of previous blood testing performed at his PCPs office which showed an HCV antibody of 0.2 (cutoff for indeterminate result was 0.8-0.9).  The patient had CMP on 09/08/2020 which showed an AST of 20 and an ALT of 26, normal total bilirubin of 0.8, and alkaline phosphatase of 71, normal electrolytes, slightly increased creatinine of 1.59.  Denies ever having IV/inhaled drugs, blood transfusion or tattoos. No history of hepatitis in his family.  Last Colonoscopy: 2021  - Medium-sized lipoma in the cecum. - Non-bleeding internal hemorrhoids.  Past Medical History: Past Medical History:  Diagnosis Date   Hypertension    Ulcer     Past Surgical History: Past Surgical History:  Procedure Laterality Date   BLADDER SURGERY     COLONOSCOPY WITH PROPOFOL N/A 11/06/2019   Procedure: COLONOSCOPY WITH PROPOFOL;  Surgeon: Harvel Quale, MD;  Location: [facility];  Service: Gastroenterology;  Laterality: N/A;  1015    Family History: Family History  Problem Relation Age of Onset   Diabetes Mother    Diabetes Sister    Diabetes Brother    Diabetes Sister    Diabetes Sister     Social History: Social History   Tobacco Use  Smoking Status Former  Smokeless Tobacco Never   Social History   Substance and Sexual Activity  Alcohol Use No   Social History   Substance and Sexual Activity  Drug Use No    Allergies: No Known Allergies  Medications: Current Outpatient Medications  Medication Sig Dispense Refill   acetaminophen (TYLENOL) 500 MG tablet Take 500 mg by mouth every 6 (six) hours as needed for moderate pain or headache.     benazepril (LOTENSIN) 40 MG tablet Take 40 mg by mouth daily.     chlorthalidone (HYGROTON) 50 MG tablet Take 50 mg by mouth daily.     cholecalciferol (VITAMIN D3) 25 MCG (1000 UNIT) tablet Take 1,000 Units by mouth daily.     fish oil-omega-3 fatty acids 1000 MG capsule Take 1 g by mouth daily.     potassium chloride SA (KLOR-CON) 20 MEQ tablet Take 20 mEq by mouth daily.      verapamil (CALAN-SR) 240 MG CR tablet Take 240 mg by mouth daily.     No current facility-administered medications for this visit.    Review of Systems: GENERAL: negative for malaise, night sweats HEENT: No changes in hearing or vision, no nose bleeds or other nasal problems. NECK: Negative for lumps, goiter, pain and significant neck swelling RESPIRATORY: Negative for cough, wheezing  CARDIOVASCULAR: Negative for chest pain, leg swelling, palpitations, orthopnea GI: SEE HPI MUSCULOSKELETAL: Negative for joint pain or swelling, back pain, and muscle pain. SKIN: Negative for lesions, rash PSYCH: Negative for sleep disturbance, mood disorder and recent psychosocial stressors. HEMATOLOGY Negative for prolonged bleeding, bruising easily, and swollen nodes. ENDOCRINE: Negative for cold or heat  intolerance, polyuria, polydipsia and goiter. NEURO: negative for tremor, gait imbalance, syncope and seizures. The remainder of the review of systems is noncontributory.   Physical Exam: BP (!) 185/100 (BP Location: Left Arm, Patient Position: Sitting, Cuff Size: Large)   Pulse (!) 103   Temp 97.7 F (36.5 C) (Oral)   Ht 6' (1.829 m)   Wt 245 lb (111.1 kg)   BMI 33.23 kg/m  GENERAL: The patient is AO x3, in no acute distress. HEENT: Head is normocephalic and atraumatic. EOMI are intact. Mouth is well hydrated and without lesions. NECK: Supple. No masses LUNGS: Clear to auscultation. No presence of rhonchi/wheezing/rales. Adequate chest expansion HEART: RRR, normal s1 and s2. ABDOMEN: Soft, nontender, no guarding, no peritoneal signs, and nondistended. BS +. No masses. EXTREMITIES: Without any cyanosis, clubbing, rash, lesions or edema. NEUROLOGIC: AOx3, no focal motor deficit. SKIN: no jaundice, no rashes  Imaging/Labs: as above  I personally reviewed and interpreted the available labs, imaging and endoscopic files.  Impression and Plan: Sergio Alvarez is a 70 y.o. male with past medical history of hypertension, who presents for evaluation of possible positive hepatitis C testing.  The patient has been asymptomatic and has presented normal liver enzymes in most recent blood work-up.  He does not have any risk factors for exposure to hepatitis C.  Notably, I reviewed the testing he had performed recently which in my opinion shows that he had a negative result for hepatitis C antibody.  It is unclear to me if there was any other test that was suggestive of hepatitis C.  I explained to the patient that at this moment we will repeat his hepatitis C antibody.  If this comes back negative, it  will rule out chronic hepatitis C.  Patient understood and agreed.  - Check hepatitis C ab  All questions were answered.      Harvel Quale, MD Gastroenterology and  Hepatology [facility]

## 2020-12-22 NOTE — Patient Instructions (Signed)
Perform blood workup  

## 2020-12-23 LAB — HEPATITIS C ANTIBODY
Hepatitis C Ab: NONREACTIVE
SIGNAL TO CUT-OFF: 0.21 (ref <1.00)

## 2020-12-24 ENCOUNTER — Encounter (INDEPENDENT_AMBULATORY_CARE_PROVIDER_SITE_OTHER): Payer: Self-pay

## 2021-03-26 DIAGNOSIS — E7849 Other hyperlipidemia: Secondary | ICD-10-CM | POA: Diagnosis not present

## 2021-03-26 DIAGNOSIS — Z6834 Body mass index (BMI) 34.0-34.9, adult: Secondary | ICD-10-CM | POA: Diagnosis not present

## 2021-03-26 DIAGNOSIS — N182 Chronic kidney disease, stage 2 (mild): Secondary | ICD-10-CM | POA: Diagnosis not present

## 2021-03-26 DIAGNOSIS — I10 Essential (primary) hypertension: Secondary | ICD-10-CM | POA: Diagnosis not present

## 2021-03-26 DIAGNOSIS — J3089 Other allergic rhinitis: Secondary | ICD-10-CM | POA: Diagnosis not present

## 2021-03-26 DIAGNOSIS — E119 Type 2 diabetes mellitus without complications: Secondary | ICD-10-CM | POA: Diagnosis not present

## 2021-03-26 DIAGNOSIS — G603 Idiopathic progressive neuropathy: Secondary | ICD-10-CM | POA: Diagnosis not present

## 2021-03-26 DIAGNOSIS — M545 Low back pain, unspecified: Secondary | ICD-10-CM | POA: Diagnosis not present

## 2021-03-26 DIAGNOSIS — Z0000 Encounter for general adult medical examination without abnormal findings: Secondary | ICD-10-CM | POA: Diagnosis not present

## 2021-03-26 DIAGNOSIS — Z1331 Encounter for screening for depression: Secondary | ICD-10-CM | POA: Diagnosis not present

## 2021-06-30 DIAGNOSIS — K219 Gastro-esophageal reflux disease without esophagitis: Secondary | ICD-10-CM | POA: Diagnosis not present

## 2021-06-30 DIAGNOSIS — I10 Essential (primary) hypertension: Secondary | ICD-10-CM | POA: Diagnosis not present

## 2021-06-30 DIAGNOSIS — Z6834 Body mass index (BMI) 34.0-34.9, adult: Secondary | ICD-10-CM | POA: Diagnosis not present

## 2021-06-30 DIAGNOSIS — J3089 Other allergic rhinitis: Secondary | ICD-10-CM | POA: Diagnosis not present

## 2021-06-30 DIAGNOSIS — E1169 Type 2 diabetes mellitus with other specified complication: Secondary | ICD-10-CM | POA: Diagnosis not present

## 2021-06-30 DIAGNOSIS — E7849 Other hyperlipidemia: Secondary | ICD-10-CM | POA: Diagnosis not present

## 2021-06-30 DIAGNOSIS — N182 Chronic kidney disease, stage 2 (mild): Secondary | ICD-10-CM | POA: Diagnosis not present

## 2021-06-30 DIAGNOSIS — M5459 Other low back pain: Secondary | ICD-10-CM | POA: Diagnosis not present

## 2021-08-16 IMAGING — DX DG CHEST 2V
2 series · 2 of 2 positions shown · non-contrast
Comparison: 09/30/2019

CLINICAL DATA: Chest pain.

EXAM:
CHEST - 2 VIEW

[chest pa]
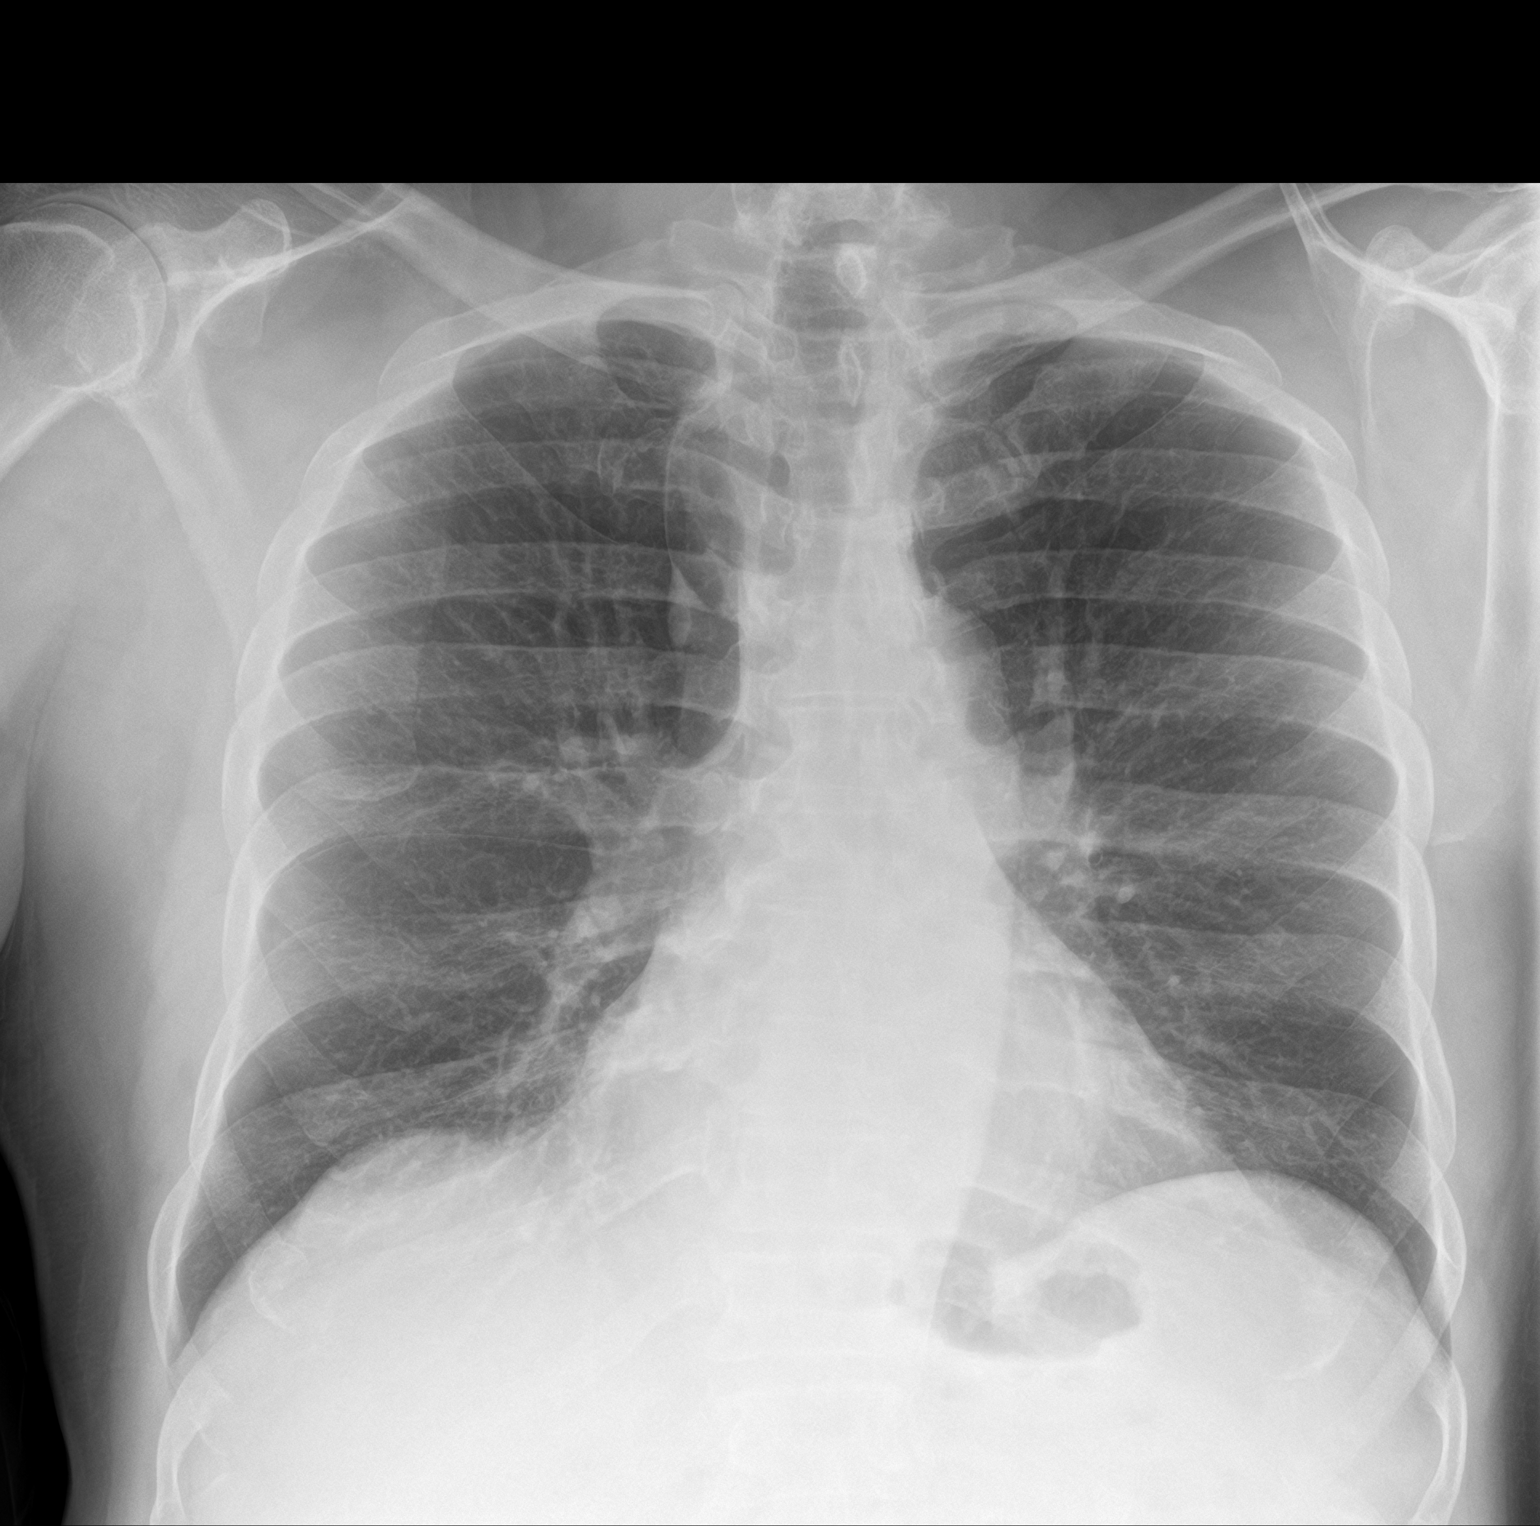

[chest lat]
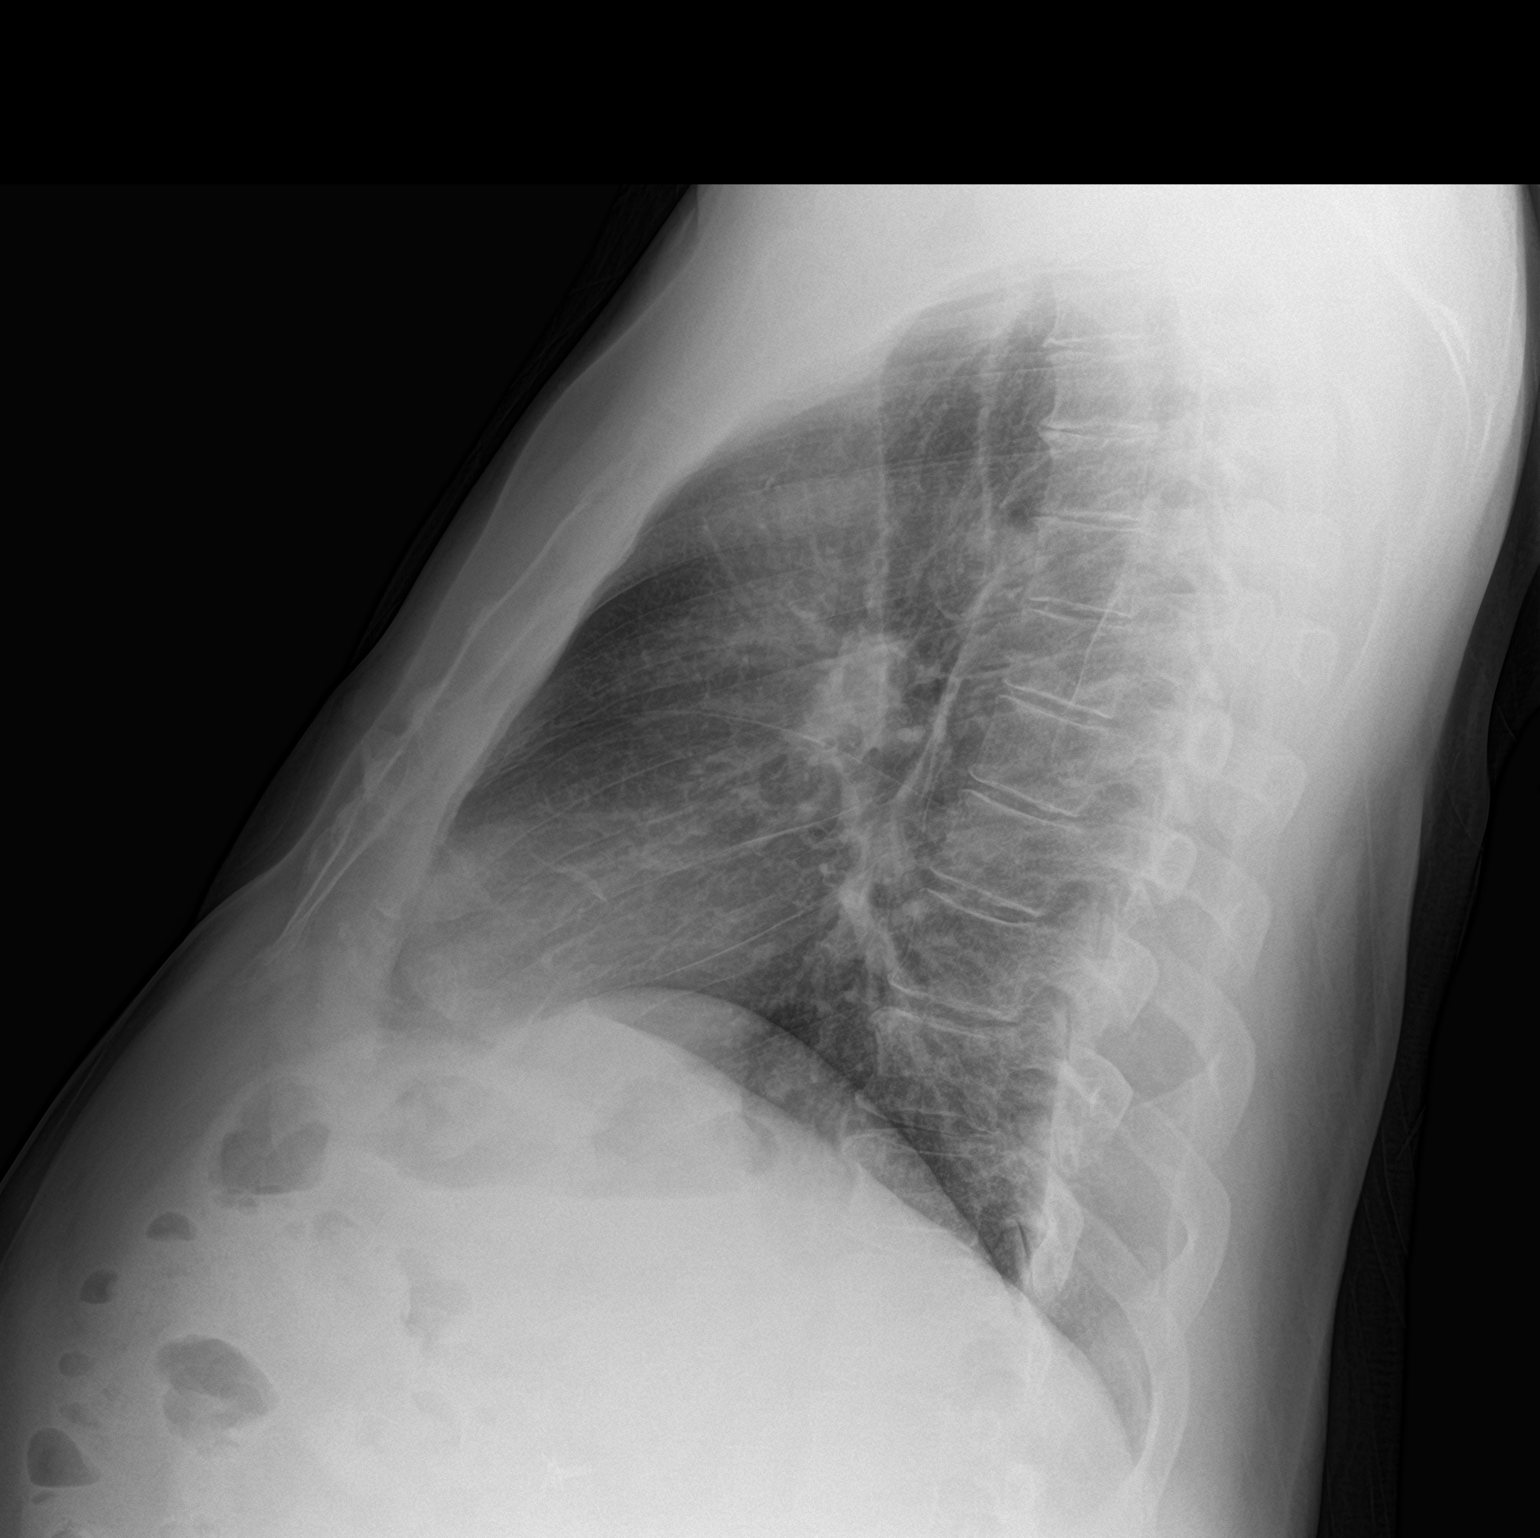

[2 of 2 positions shown; findings below may reference images not displayed]

FINDINGS: Both lungs are clear. Again noted is an azygos lobe. Heart and
mediastinum are within normal limits and stable. No pleural
effusions. Degenerative changes in the upper and lower thoracic
spine.
IMPRESSION: No active cardiopulmonary disease.

## 2021-09-04 ENCOUNTER — Emergency Department (HOSPITAL_COMMUNITY)
Admission: EM | Admit: 2021-09-04 | Discharge: 2021-09-04 | Disposition: A | Discharge status: Home / Self Care | Payer: Medicare HMO | Attending: Emergency Medicine | Admitting: Emergency Medicine

## 2021-09-04 ENCOUNTER — Other Ambulatory Visit: Payer: Self-pay

## 2021-09-04 ENCOUNTER — Encounter (HOSPITAL_COMMUNITY): Payer: Self-pay

## 2021-09-04 DIAGNOSIS — I10 Essential (primary) hypertension: Secondary | ICD-10-CM | POA: Insufficient documentation

## 2021-09-04 DIAGNOSIS — S3992XA Unspecified injury of lower back, initial encounter: Secondary | ICD-10-CM | POA: Diagnosis present

## 2021-09-04 DIAGNOSIS — X58XXXA Exposure to other specified factors, initial encounter: Secondary | ICD-10-CM | POA: Insufficient documentation

## 2021-09-04 DIAGNOSIS — S39012A Strain of muscle, fascia and tendon of lower back, initial encounter: Secondary | ICD-10-CM | POA: Diagnosis not present

## 2021-09-04 DIAGNOSIS — Z79899 Other long term (current) drug therapy: Secondary | ICD-10-CM | POA: Diagnosis not present

## 2021-09-04 MED ORDER — LIDOCAINE 5 % EX PTCH
1.0000 | MEDICATED_PATCH | Freq: Once | CUTANEOUS | Status: DC
  Administered 2021-09-04: 1 via TRANSDERMAL
  Filled 2021-09-04: qty 1

## 2021-09-04 MED ORDER — LIDOCAINE 5 % EX PTCH: 1.0000 | MEDICATED_PATCH | CUTANEOUS | 0 refills | Status: AC

## 2021-09-04 MED ORDER — IBUPROFEN 400 MG PO TABS
400.0000 mg | ORAL_TABLET | Freq: Once | ORAL | Status: AC
  Administered 2021-09-04: 400 mg via ORAL
  Filled 2021-09-04: qty 1

## 2021-09-04 MED ORDER — ACETAMINOPHEN 325 MG PO TABS
650.0000 mg | ORAL_TABLET | Freq: Once | ORAL | Status: AC
  Administered 2021-09-04: 650 mg via ORAL
  Filled 2021-09-04: qty 2

## 2021-09-04 MED ORDER — IBUPROFEN 400 MG PO TABS: 400.0000 mg | ORAL_TABLET | Freq: Four times a day (QID) | ORAL | 0 refills | Status: AC | PRN

## 2021-09-04 NOTE — ED Provider Notes (Signed)
[facility] EMERGENCY DEPARTMENT Provider Note   CSN: [id number] Arrival date & time: 09/04/21  4665     History  Chief Complaint  Patient presents with   Back Pain    Sergio Alvarez is a 71 y.o. male.  Patient is a 71 year old male with a past medical history of hypertension presenting to the emergency department with right-sided low back pain.  Patient states that the pain has been ongoing for about 1 week.  He states that the pain started when he woke up 1 morning and tends to occur most mornings.  He states that the pain gets better throughout the day when he is moving around.  He denies any trauma or falls, recent heavy lifting or changes in activity.  He denies any numbness or weakness, saddle anesthesia, loss of bowel or bladder function.  He denies any fevers, dysuria or hematuria.  He denies any prior back injuries or surgeries.  He denies any history of IV drug use.  He states he has been taking Tylenol for pain at home which helps but the pain always returns the next day.  The history is provided by the patient.  Back Pain      Home Medications Prior to Admission medications   Medication Sig Start Date End Date Taking? Authorizing Provider  ibuprofen (ADVIL) 400 MG tablet Take 1 tablet (400 mg total) by mouth every 6 (six) hours as needed. 09/04/21  Yes Adeel Guiffre, [place] K, DO  lidocaine (LIDODERM) 5 % Place 1 patch onto the skin daily. Remove & Discard patch within 12 hours or as directed by MD 09/04/21  Yes Ernesto Rutherford, Norman Herrlich, DO  acetaminophen (TYLENOL) 500 MG tablet Take 500 mg by mouth every 6 (six) hours as needed for moderate pain or headache.    [provider]  chlorthalidone (HYGROTON) 50 MG tablet Take 50 mg by mouth daily. 10/18/19   [provider]  cholecalciferol (VITAMIN D3) 25 MCG (1000 UNIT) tablet Take 1,000 Units by mouth daily.    [provider]  fish oil-omega-3 fatty acids 1000 MG capsule Take 1 g by mouth daily.    [provider]  losartan (COZAAR) 100 MG tablet Take 100 mg by mouth daily. 08/06/21   [provider]  metFORMIN (GLUCOPHAGE) 1000 MG tablet Take 1,000 mg by mouth daily. 06/30/21   [provider]  metoprolol (TOPROL-XL) 200 MG 24 hr tablet Take 200 mg by mouth daily. 08/20/21   [provider]  potassium chloride SA (KLOR-CON) 20 MEQ tablet Take 20 mEq by mouth daily.     [provider]  verapamil (CALAN-SR) 240 MG CR tablet Take 240 mg by mouth daily.    [provider]      Allergies    Patient has no known allergies.    Review of Systems   Review of Systems  Musculoskeletal:  Positive for back pain.    Physical Exam Updated Vital Signs BP (!) 158/73   Pulse 82   Temp 98.3 F (36.8 C) (Oral)   Resp 16   Ht 6' (1.829 m)   Wt 111.1 kg   SpO2 96%   BMI 33.23 kg/m  Physical Exam Vitals and nursing note reviewed.  Constitutional:      General: He is not in acute distress.    Appearance: Normal appearance. He is obese.  HENT:     Head: Normocephalic and atraumatic.     Nose: Nose normal.     Mouth/Throat:  Mouth: Mucous membranes are moist.  Eyes:     Extraocular Movements: Extraocular movements intact.     Conjunctiva/sclera: Conjunctivae normal.  Neck:     Comments: No midline neck tenderness Cardiovascular:     Rate and Rhythm: Normal rate and regular rhythm.  Pulmonary:     Effort: Pulmonary effort is normal.     Breath sounds: Normal breath sounds.  Abdominal:     General: Abdomen is flat.     Palpations: Abdomen is soft.     Tenderness: There is no abdominal tenderness.  Musculoskeletal:        General: Normal range of motion.     Cervical back: Normal range of motion and neck supple.     Comments: No midline back tenderness.  Tenderness to palpation of right sided lumbar paraspinal muscles no overlying skin changes.  No CVA tenderness  Skin:    General: Skin is warm and dry.  Neurological:     General: No  focal deficit present.     Mental Status: He is alert and oriented to person, place, and time.     Sensory: No sensory deficit.     Motor: No weakness.  Psychiatric:        Mood and Affect: Mood normal.        Behavior: Behavior normal.     ED Results / Procedures / Treatments   Labs (all labs ordered are listed, but only abnormal results are displayed) Labs Reviewed - No data to display  EKG None  Radiology No results found.  Procedures Procedures    Medications Ordered in ED Medications  lidocaine (LIDODERM) 5 % 1 patch (1 patch Transdermal Patch Applied 09/04/21 1003)  ibuprofen (ADVIL) tablet 400 mg (400 mg Oral Given 09/04/21 1003)  acetaminophen (TYLENOL) tablet 650 mg (650 mg Oral Given 09/04/21 1003)    ED Course/ Medical Decision Making/ A&P Clinical Course as of 09/04/21 1038  Fri Sep 04, 2021  1034 Upon reassessment, patient reports improvement of his pain.  He is able to ambulate steadily to the bathroom without any difficulty.  He is stable for discharge home with outpatient primary care follow-up.  He was given strict return precautions. [VK]    Clinical Course User Index [VK] Ottie Glazier, DO                           Medical Decision Making Patient is a 71 year old male with no significant past medical history presenting to the emergency department with right-sided low back pain.  Patient has no focal neurologic deficits, no trauma, no fevers, no midline neck or back tenderness, no urinary retention, no saddle anesthesia, making spinal epidural abscess, cauda equina, or fracture unlikely.  He has no urinary symptoms making pyelonephritis or nephrolithiasis unlikely.  He does have reproducible pain on the right lumbar paraspinal muscles, making muscle strain likely.  He will be given Tylenol, Motrin and lidocaine patch and be reassessed.  Risk OTC drugs. Prescription drug management.          Final Clinical Impression(s) / ED Diagnoses Final  diagnoses:  Strain of lumbar region, initial encounter    Rx / DC Orders ED Discharge Orders          Ordered    lidocaine (LIDODERM) 5 %  Every 24 hours        09/04/21 1036    ibuprofen (ADVIL) 400 MG tablet  Every 6 hours PRN  09/04/21 1036              Endrit Gittins, Sandy, DO 09/04/21 1038

## 2021-09-04 NOTE — Discharge Instructions (Addendum)
You were seen in the emergency department for your back pain.  You had no signs of any broken bones, infections or injury to the ER spinal cord or nerves.  Your pain is likely due to a strained muscle in your back.  You can continue to take Tylenol as well as Motrin and use lidocaine patches or heat packs as needed for pain.  You should try to stretch her back.  You should follow-up with your primary doctor in the next week to have your symptoms rechecked and if you are having continued pain you may benefit from physical therapy.  You should return to the emergency department if you start to develop numbness or weakness, you are having difficulty walking, you have fevers, you cannot control your bowels or your bladder or if you have any other new or concerning symptoms.

## 2021-09-04 NOTE — ED Triage Notes (Signed)
Pt c/o lower back pain x 1 week. Pt has not fallen or injured it from his knowledge.

## 2021-09-08 DIAGNOSIS — J3089 Other allergic rhinitis: Secondary | ICD-10-CM | POA: Diagnosis not present

## 2021-09-08 DIAGNOSIS — I10 Essential (primary) hypertension: Secondary | ICD-10-CM | POA: Diagnosis not present

## 2021-09-08 DIAGNOSIS — E1169 Type 2 diabetes mellitus with other specified complication: Secondary | ICD-10-CM | POA: Diagnosis not present

## 2021-09-08 DIAGNOSIS — E7849 Other hyperlipidemia: Secondary | ICD-10-CM | POA: Diagnosis not present

## 2021-09-08 DIAGNOSIS — K219 Gastro-esophageal reflux disease without esophagitis: Secondary | ICD-10-CM | POA: Diagnosis not present

## 2021-09-08 DIAGNOSIS — M5459 Other low back pain: Secondary | ICD-10-CM | POA: Diagnosis not present

## 2021-09-08 DIAGNOSIS — Z6833 Body mass index (BMI) 33.0-33.9, adult: Secondary | ICD-10-CM | POA: Diagnosis not present

## 2021-09-08 DIAGNOSIS — N1831 Chronic kidney disease, stage 3a: Secondary | ICD-10-CM | POA: Diagnosis not present

## 2021-09-11 DIAGNOSIS — I10 Essential (primary) hypertension: Secondary | ICD-10-CM | POA: Diagnosis not present

## 2021-09-11 DIAGNOSIS — N1831 Chronic kidney disease, stage 3a: Secondary | ICD-10-CM | POA: Diagnosis not present

## 2021-09-11 DIAGNOSIS — E7849 Other hyperlipidemia: Secondary | ICD-10-CM | POA: Diagnosis not present

## 2021-09-11 DIAGNOSIS — M5459 Other low back pain: Secondary | ICD-10-CM | POA: Diagnosis not present

## 2021-09-11 DIAGNOSIS — J3089 Other allergic rhinitis: Secondary | ICD-10-CM | POA: Diagnosis not present

## 2021-09-11 DIAGNOSIS — K219 Gastro-esophageal reflux disease without esophagitis: Secondary | ICD-10-CM | POA: Diagnosis not present

## 2021-09-11 DIAGNOSIS — E1169 Type 2 diabetes mellitus with other specified complication: Secondary | ICD-10-CM | POA: Diagnosis not present

## 2021-09-21 DIAGNOSIS — R9439 Abnormal result of other cardiovascular function study: Secondary | ICD-10-CM | POA: Diagnosis not present

## 2021-09-21 DIAGNOSIS — I739 Peripheral vascular disease, unspecified: Secondary | ICD-10-CM | POA: Diagnosis not present

## 2021-12-10 DIAGNOSIS — E1169 Type 2 diabetes mellitus with other specified complication: Secondary | ICD-10-CM | POA: Diagnosis not present

## 2021-12-10 DIAGNOSIS — Z6833 Body mass index (BMI) 33.0-33.9, adult: Secondary | ICD-10-CM | POA: Diagnosis not present

## 2021-12-10 DIAGNOSIS — E7849 Other hyperlipidemia: Secondary | ICD-10-CM | POA: Diagnosis not present

## 2021-12-10 DIAGNOSIS — I10 Essential (primary) hypertension: Secondary | ICD-10-CM | POA: Diagnosis not present

## 2021-12-10 DIAGNOSIS — M5459 Other low back pain: Secondary | ICD-10-CM | POA: Diagnosis not present

## 2021-12-10 DIAGNOSIS — K219 Gastro-esophageal reflux disease without esophagitis: Secondary | ICD-10-CM | POA: Diagnosis not present

## 2021-12-10 DIAGNOSIS — N1831 Chronic kidney disease, stage 3a: Secondary | ICD-10-CM | POA: Diagnosis not present

## 2021-12-10 DIAGNOSIS — Z0000 Encounter for general adult medical examination without abnormal findings: Secondary | ICD-10-CM | POA: Diagnosis not present

## 2021-12-10 DIAGNOSIS — J3089 Other allergic rhinitis: Secondary | ICD-10-CM | POA: Diagnosis not present

## 2022-03-18 DIAGNOSIS — J3089 Other allergic rhinitis: Secondary | ICD-10-CM | POA: Diagnosis not present

## 2022-03-18 DIAGNOSIS — Z6833 Body mass index (BMI) 33.0-33.9, adult: Secondary | ICD-10-CM | POA: Diagnosis not present

## 2022-03-18 DIAGNOSIS — K219 Gastro-esophageal reflux disease without esophagitis: Secondary | ICD-10-CM | POA: Diagnosis not present

## 2022-03-18 DIAGNOSIS — M5459 Other low back pain: Secondary | ICD-10-CM | POA: Diagnosis not present

## 2022-03-18 DIAGNOSIS — E7849 Other hyperlipidemia: Secondary | ICD-10-CM | POA: Diagnosis not present

## 2022-03-18 DIAGNOSIS — N1831 Chronic kidney disease, stage 3a: Secondary | ICD-10-CM | POA: Diagnosis not present

## 2022-03-18 DIAGNOSIS — Z0000 Encounter for general adult medical examination without abnormal findings: Secondary | ICD-10-CM | POA: Diagnosis not present

## 2022-03-18 DIAGNOSIS — E1169 Type 2 diabetes mellitus with other specified complication: Secondary | ICD-10-CM | POA: Diagnosis not present

## 2022-03-18 DIAGNOSIS — I10 Essential (primary) hypertension: Secondary | ICD-10-CM | POA: Diagnosis not present

## 2022-06-16 DIAGNOSIS — J3089 Other allergic rhinitis: Secondary | ICD-10-CM | POA: Diagnosis not present

## 2022-06-16 DIAGNOSIS — Z0000 Encounter for general adult medical examination without abnormal findings: Secondary | ICD-10-CM | POA: Diagnosis not present

## 2022-06-16 DIAGNOSIS — Z6832 Body mass index (BMI) 32.0-32.9, adult: Secondary | ICD-10-CM | POA: Diagnosis not present

## 2022-06-16 DIAGNOSIS — E7849 Other hyperlipidemia: Secondary | ICD-10-CM | POA: Diagnosis not present

## 2022-06-16 DIAGNOSIS — N1832 Chronic kidney disease, stage 3b: Secondary | ICD-10-CM | POA: Diagnosis not present

## 2022-06-16 DIAGNOSIS — M5459 Other low back pain: Secondary | ICD-10-CM | POA: Diagnosis not present

## 2022-06-16 DIAGNOSIS — I10 Essential (primary) hypertension: Secondary | ICD-10-CM | POA: Diagnosis not present

## 2022-06-16 DIAGNOSIS — K219 Gastro-esophageal reflux disease without esophagitis: Secondary | ICD-10-CM | POA: Diagnosis not present

## 2022-06-16 DIAGNOSIS — E1121 Type 2 diabetes mellitus with diabetic nephropathy: Secondary | ICD-10-CM | POA: Diagnosis not present

## 2022-08-13 ENCOUNTER — Encounter (HOSPITAL_COMMUNITY): Payer: Self-pay | Admitting: Emergency Medicine

## 2022-08-13 ENCOUNTER — Emergency Department (HOSPITAL_COMMUNITY): Payer: Medicare HMO

## 2022-08-13 ENCOUNTER — Other Ambulatory Visit: Payer: Self-pay

## 2022-08-13 ENCOUNTER — Emergency Department (HOSPITAL_COMMUNITY)
Admission: EM | Admit: 2022-08-13 | Discharge: 2022-08-13 | Disposition: A | Discharge status: Home / Self Care | Payer: Medicare HMO | Attending: Emergency Medicine | Admitting: Emergency Medicine

## 2022-08-13 DIAGNOSIS — I10 Essential (primary) hypertension: Secondary | ICD-10-CM | POA: Insufficient documentation

## 2022-08-13 DIAGNOSIS — M25521 Pain in right elbow: Secondary | ICD-10-CM | POA: Insufficient documentation

## 2022-08-13 DIAGNOSIS — Z79899 Other long term (current) drug therapy: Secondary | ICD-10-CM | POA: Insufficient documentation

## 2022-08-13 DIAGNOSIS — S53194A Other dislocation of right ulnohumeral joint, initial encounter: Secondary | ICD-10-CM | POA: Diagnosis not present

## 2022-08-13 NOTE — ED Triage Notes (Signed)
Pt with c/o Right elbow pain x "a couple of days". Denies any known injury but states he was changing a car tire recently and isn't sure if that is "what started it".

## 2022-08-13 NOTE — ED Provider Notes (Signed)
[place] EMERGENCY DEPARTMENT AT [facility] Provider Note   CSN: [id number] Arrival date & time: 08/13/22  1919     History  Chief Complaint  Patient presents with   Elbow Pain    Sergio Alvarez is a 72 y.o. male.  He has a history of hypertension.  Right-hand-dominant.  Complaining of right elbow pain that is been going on for a few days.  Worse with flexion supination pronation.  Does not recall any trauma but he does remember he was changing a tire and it started hurting after that.  No numbness or weakness.  No fever.  The history is provided by the patient.  Arm Injury Location:  Elbow Elbow location:  R elbow Injury: no   Pain details:    Quality:  Aching   Severity:  Moderate   Onset quality:  Gradual   Duration:  3 days   Timing:  Constant   Progression:  Unchanged Handedness:  Right-handed Dislocation: no   Relieved by:  None tried Worsened by:  Movement Ineffective treatments:  None tried Associated symptoms: decreased range of motion and swelling   Associated symptoms: no fever, no muscle weakness, no numbness and no tingling        Home Medications Prior to Admission medications   Medication Sig Start Date End Date Taking? Authorizing Provider  acetaminophen (TYLENOL) 500 MG tablet Take 500 mg by mouth every 6 (six) hours as needed for moderate pain or headache.    [provider]  chlorthalidone (HYGROTON) 50 MG tablet Take 50 mg by mouth daily. 10/18/19   [provider]  cholecalciferol (VITAMIN D3) 25 MCG (1000 UNIT) tablet Take 1,000 Units by mouth daily.    [provider]  fish oil-omega-3 fatty acids 1000 MG capsule Take 1 g by mouth daily.    [provider]  ibuprofen (ADVIL) 400 MG tablet Take 1 tablet (400 mg total) by mouth every 6 (six) hours as needed. 09/04/21   Elayne Snare K, DO  lidocaine (LIDODERM) 5 % Place 1 patch onto the skin daily. Remove & Discard patch within 12 hours or as  directed by MD 09/04/21   Elayne Snare K, DO  losartan (COZAAR) 100 MG tablet Take 100 mg by mouth daily. 08/06/21   [provider]  metFORMIN (GLUCOPHAGE) 1000 MG tablet Take 1,000 mg by mouth daily. 06/30/21   [provider]  metoprolol (TOPROL-XL) 200 MG 24 hr tablet Take 200 mg by mouth daily. 08/20/21   [provider]  potassium chloride SA (KLOR-CON) 20 MEQ tablet Take 20 mEq by mouth daily.     [provider]  verapamil (CALAN-SR) 240 MG CR tablet Take 240 mg by mouth daily.    [provider]      Allergies    Patient has no known allergies.    Review of Systems   Review of Systems  Constitutional:  Negative for fever.  Skin:  Negative for wound.  Neurological:  Negative for weakness and numbness.    Physical Exam Updated Vital Signs BP (!) 181/76   Pulse 80   Temp 98.9 F (37.2 C) (Oral)   Resp 20   Ht 6' (1.829 m)   Wt 106.6 kg   SpO2 98%   BMI 31.87 kg/m  Physical Exam Vitals and nursing note reviewed.  Constitutional:      Appearance: Normal appearance. He is well-developed.  HENT:     Head: Normocephalic and atraumatic.  Eyes:  Conjunctiva/sclera: Conjunctivae normal.  Pulmonary:     Effort: Pulmonary effort is normal.  Musculoskeletal:        General: Swelling and tenderness present.     Cervical back: Neck supple.     Comments: Right upper extremity nontender shoulder wrist hand.  Radial pulse 2+ distal sensation and motor intact.  He has some mild swelling of his elbow.  He is tender over the lateral condyle.  No radial head tenderness.  He has pain with flexion extension supination pronation.  No significant warmth or erythema.  No open wounds.  Skin:    General: Skin is warm and dry.     Capillary Refill: Capillary refill takes less than 2 seconds.  Neurological:     General: No focal deficit present.     Mental Status: He is alert.     GCS: GCS eye subscore is 4. GCS verbal subscore is 5. GCS  motor subscore is 6.     Cranial Nerves: No cranial nerve deficit.     Sensory: No sensory deficit.     Motor: No weakness.     ED Results / Procedures / Treatments   Labs (all labs ordered are listed, but only abnormal results are displayed) Labs Reviewed - No data to display  EKG None  Radiology DG Elbow Complete Right  Result Date: 08/13/2022 CLINICAL DATA:  Elbow pain EXAM: RIGHT ELBOW - COMPLETE 3+ VIEW COMPARISON:  None Available. FINDINGS: There is no evidence of fracture, dislocation, or joint effusion. There is no evidence of arthropathy or other focal bone abnormality. Soft tissues are unremarkable. IMPRESSION: Negative. Electronically Signed   By: Larose Hires D.O.   On: 08/13/2022 19:57    Procedures Procedures    Medications Ordered in ED Medications - No data to display  ED Course/ Medical Decision Making/ A&P Clinical Course as of 08/13/22 2116  Fri Aug 13, 2022  1952 X-ray interpreted by me as no acute fracture or dislocation.  Awaiting radiology reading. [MB]    Clinical Course User Index [MB] Terrilee Files, MD                             Medical Decision Making Amount and/or Complexity of Data Reviewed Radiology: ordered.   This patient complains of right elbow pain; this involves an extensive number of treatment Options and is a complaint that carries with it a high risk of complications and morbidity. The differential includes fracture, dislocation, contusion, tendinitis, septic joint, bursitis I ordered imaging studies which included right elbow x-ray and I independently    visualized and interpreted imaging which showed no fracture or dislocation, no joint effusion Previous records obtained and reviewed in epic no recent admissions Social determinants considered, no significant barriers Critical Interventions: None  After the interventions stated above, I reevaluated the patient and found patient to be neurovascularly intact no  distress Admission and further testing considered, will place in sling, have him do ice and Tylenol, as he says he cannot to NSAIDs.  He does not want any stronger medication.  Given contact information for orthopedics.  Return instructions discussed         Final Clinical Impression(s) / ED Diagnoses Final diagnoses:  Right elbow pain    Rx / DC Orders ED Discharge Orders     None         Terrilee Files, MD 08/13/22 2117

## 2022-08-13 NOTE — Discharge Instructions (Signed)
You were seen in the emergency department for right elbow pain.  Your x-ray did not show any fracture or dislocation.  Please use ice to the affected area and sling for comfort.  Tylenol for pain.  Contact orthopedics on Monday for evaluation.  Return to the emergency department if any worsening or concerning symptoms

## 2022-08-18 ENCOUNTER — Telehealth: Payer: Self-pay | Admitting: *Deleted

## 2022-08-23 DIAGNOSIS — Z125 Encounter for screening for malignant neoplasm of prostate: Secondary | ICD-10-CM | POA: Diagnosis not present

## 2022-08-23 DIAGNOSIS — E7849 Other hyperlipidemia: Secondary | ICD-10-CM | POA: Diagnosis not present

## 2022-08-23 DIAGNOSIS — M25521 Pain in right elbow: Secondary | ICD-10-CM | POA: Diagnosis not present

## 2022-08-23 DIAGNOSIS — N1832 Chronic kidney disease, stage 3b: Secondary | ICD-10-CM | POA: Diagnosis not present

## 2022-08-23 DIAGNOSIS — I10 Essential (primary) hypertension: Secondary | ICD-10-CM | POA: Diagnosis not present

## 2022-08-23 DIAGNOSIS — Z6832 Body mass index (BMI) 32.0-32.9, adult: Secondary | ICD-10-CM | POA: Diagnosis not present

## 2022-11-22 DIAGNOSIS — E7849 Other hyperlipidemia: Secondary | ICD-10-CM | POA: Diagnosis not present

## 2022-11-22 DIAGNOSIS — Z6833 Body mass index (BMI) 33.0-33.9, adult: Secondary | ICD-10-CM | POA: Diagnosis not present

## 2022-11-22 DIAGNOSIS — J3089 Other allergic rhinitis: Secondary | ICD-10-CM | POA: Diagnosis not present

## 2022-11-22 DIAGNOSIS — N1831 Chronic kidney disease, stage 3a: Secondary | ICD-10-CM | POA: Diagnosis not present

## 2022-11-22 DIAGNOSIS — E1121 Type 2 diabetes mellitus with diabetic nephropathy: Secondary | ICD-10-CM | POA: Diagnosis not present

## 2022-11-22 DIAGNOSIS — K219 Gastro-esophageal reflux disease without esophagitis: Secondary | ICD-10-CM | POA: Diagnosis not present

## 2022-11-22 DIAGNOSIS — I10 Essential (primary) hypertension: Secondary | ICD-10-CM | POA: Diagnosis not present

## 2022-11-22 DIAGNOSIS — M5459 Other low back pain: Secondary | ICD-10-CM | POA: Diagnosis not present

## 2023-01-29 DIAGNOSIS — H524 Presbyopia: Secondary | ICD-10-CM | POA: Diagnosis not present

## 2023-03-01 DIAGNOSIS — J3089 Other allergic rhinitis: Secondary | ICD-10-CM | POA: Diagnosis not present

## 2023-03-01 DIAGNOSIS — I10 Essential (primary) hypertension: Secondary | ICD-10-CM | POA: Diagnosis not present

## 2023-03-01 DIAGNOSIS — Z0000 Encounter for general adult medical examination without abnormal findings: Secondary | ICD-10-CM | POA: Diagnosis not present

## 2023-03-01 DIAGNOSIS — K219 Gastro-esophageal reflux disease without esophagitis: Secondary | ICD-10-CM | POA: Diagnosis not present

## 2023-03-01 DIAGNOSIS — E7849 Other hyperlipidemia: Secondary | ICD-10-CM | POA: Diagnosis not present

## 2023-03-01 DIAGNOSIS — N1831 Chronic kidney disease, stage 3a: Secondary | ICD-10-CM | POA: Diagnosis not present

## 2023-03-01 DIAGNOSIS — E1121 Type 2 diabetes mellitus with diabetic nephropathy: Secondary | ICD-10-CM | POA: Diagnosis not present

## 2023-03-01 DIAGNOSIS — E1122 Type 2 diabetes mellitus with diabetic chronic kidney disease: Secondary | ICD-10-CM | POA: Diagnosis not present

## 2023-03-01 DIAGNOSIS — Z6833 Body mass index (BMI) 33.0-33.9, adult: Secondary | ICD-10-CM | POA: Diagnosis not present

## 2023-03-01 DIAGNOSIS — M5459 Other low back pain: Secondary | ICD-10-CM | POA: Diagnosis not present

## 2023-04-07 DIAGNOSIS — Z13820 Encounter for screening for osteoporosis: Secondary | ICD-10-CM | POA: Diagnosis not present

## 2023-04-07 DIAGNOSIS — M810 Age-related osteoporosis without current pathological fracture: Secondary | ICD-10-CM | POA: Diagnosis not present

## 2023-05-31 DIAGNOSIS — K219 Gastro-esophageal reflux disease without esophagitis: Secondary | ICD-10-CM | POA: Diagnosis not present

## 2023-05-31 DIAGNOSIS — J3089 Other allergic rhinitis: Secondary | ICD-10-CM | POA: Diagnosis not present

## 2023-05-31 DIAGNOSIS — E1122 Type 2 diabetes mellitus with diabetic chronic kidney disease: Secondary | ICD-10-CM | POA: Diagnosis not present

## 2023-05-31 DIAGNOSIS — Z0000 Encounter for general adult medical examination without abnormal findings: Secondary | ICD-10-CM | POA: Diagnosis not present

## 2023-05-31 DIAGNOSIS — I129 Hypertensive chronic kidney disease with stage 1 through stage 4 chronic kidney disease, or unspecified chronic kidney disease: Secondary | ICD-10-CM | POA: Diagnosis not present

## 2023-05-31 DIAGNOSIS — I10 Essential (primary) hypertension: Secondary | ICD-10-CM | POA: Diagnosis not present

## 2023-05-31 DIAGNOSIS — E7849 Other hyperlipidemia: Secondary | ICD-10-CM | POA: Diagnosis not present

## 2023-05-31 DIAGNOSIS — M75100 Unspecified rotator cuff tear or rupture of unspecified shoulder, not specified as traumatic: Secondary | ICD-10-CM | POA: Diagnosis not present

## 2023-05-31 DIAGNOSIS — N1832 Chronic kidney disease, stage 3b: Secondary | ICD-10-CM | POA: Diagnosis not present

## 2023-05-31 DIAGNOSIS — M5459 Other low back pain: Secondary | ICD-10-CM | POA: Diagnosis not present

## 2023-05-31 DIAGNOSIS — Z6832 Body mass index (BMI) 32.0-32.9, adult: Secondary | ICD-10-CM | POA: Diagnosis not present

## 2023-05-31 DIAGNOSIS — N183 Chronic kidney disease, stage 3 unspecified: Secondary | ICD-10-CM | POA: Diagnosis not present

## 2023-06-22 DIAGNOSIS — I739 Peripheral vascular disease, unspecified: Secondary | ICD-10-CM | POA: Diagnosis not present

## 2023-09-01 DIAGNOSIS — E7849 Other hyperlipidemia: Secondary | ICD-10-CM | POA: Diagnosis not present

## 2023-09-01 DIAGNOSIS — Z125 Encounter for screening for malignant neoplasm of prostate: Secondary | ICD-10-CM | POA: Diagnosis not present

## 2023-09-01 DIAGNOSIS — M5459 Other low back pain: Secondary | ICD-10-CM | POA: Diagnosis not present

## 2023-09-01 DIAGNOSIS — N1832 Chronic kidney disease, stage 3b: Secondary | ICD-10-CM | POA: Diagnosis not present

## 2023-09-01 DIAGNOSIS — J3089 Other allergic rhinitis: Secondary | ICD-10-CM | POA: Diagnosis not present

## 2023-09-01 DIAGNOSIS — K219 Gastro-esophageal reflux disease without esophagitis: Secondary | ICD-10-CM | POA: Diagnosis not present

## 2023-09-01 DIAGNOSIS — I10 Essential (primary) hypertension: Secondary | ICD-10-CM | POA: Diagnosis not present

## 2023-09-01 DIAGNOSIS — E1122 Type 2 diabetes mellitus with diabetic chronic kidney disease: Secondary | ICD-10-CM | POA: Diagnosis not present

## 2023-09-01 DIAGNOSIS — Z6833 Body mass index (BMI) 33.0-33.9, adult: Secondary | ICD-10-CM | POA: Diagnosis not present

## 2023-09-01 DIAGNOSIS — M75100 Unspecified rotator cuff tear or rupture of unspecified shoulder, not specified as traumatic: Secondary | ICD-10-CM | POA: Diagnosis not present

## 2023-09-28 DIAGNOSIS — M546 Pain in thoracic spine: Secondary | ICD-10-CM | POA: Diagnosis not present

## 2023-09-28 DIAGNOSIS — M47816 Spondylosis without myelopathy or radiculopathy, lumbar region: Secondary | ICD-10-CM | POA: Diagnosis not present

## 2023-09-28 DIAGNOSIS — M5126 Other intervertebral disc displacement, lumbar region: Secondary | ICD-10-CM | POA: Diagnosis not present

## 2023-09-28 DIAGNOSIS — M47814 Spondylosis without myelopathy or radiculopathy, thoracic region: Secondary | ICD-10-CM | POA: Diagnosis not present

## 2023-09-28 DIAGNOSIS — M4807 Spinal stenosis, lumbosacral region: Secondary | ICD-10-CM | POA: Diagnosis not present

## 2023-09-28 DIAGNOSIS — M545 Low back pain, unspecified: Secondary | ICD-10-CM | POA: Diagnosis not present

## 2023-09-28 DIAGNOSIS — M4804 Spinal stenosis, thoracic region: Secondary | ICD-10-CM | POA: Diagnosis not present

## 2023-11-03 DIAGNOSIS — N1832 Chronic kidney disease, stage 3b: Secondary | ICD-10-CM | POA: Diagnosis not present

## 2023-11-03 DIAGNOSIS — I10 Essential (primary) hypertension: Secondary | ICD-10-CM | POA: Diagnosis not present

## 2023-12-01 DIAGNOSIS — K219 Gastro-esophageal reflux disease without esophagitis: Secondary | ICD-10-CM | POA: Diagnosis not present

## 2023-12-01 DIAGNOSIS — M75100 Unspecified rotator cuff tear or rupture of unspecified shoulder, not specified as traumatic: Secondary | ICD-10-CM | POA: Diagnosis not present

## 2023-12-01 DIAGNOSIS — Z6834 Body mass index (BMI) 34.0-34.9, adult: Secondary | ICD-10-CM | POA: Diagnosis not present

## 2023-12-01 DIAGNOSIS — M5459 Other low back pain: Secondary | ICD-10-CM | POA: Diagnosis not present

## 2023-12-01 DIAGNOSIS — E1122 Type 2 diabetes mellitus with diabetic chronic kidney disease: Secondary | ICD-10-CM | POA: Diagnosis not present

## 2023-12-01 DIAGNOSIS — I10 Essential (primary) hypertension: Secondary | ICD-10-CM | POA: Diagnosis not present

## 2023-12-01 DIAGNOSIS — N1832 Chronic kidney disease, stage 3b: Secondary | ICD-10-CM | POA: Diagnosis not present

## 2023-12-01 DIAGNOSIS — E7849 Other hyperlipidemia: Secondary | ICD-10-CM | POA: Diagnosis not present

## 2023-12-01 DIAGNOSIS — J3089 Other allergic rhinitis: Secondary | ICD-10-CM | POA: Diagnosis not present

## 2023-12-01 NOTE — Progress Notes (Signed)
 Sergio Alvarez                                          MRN: [id number]   12/01/2023   The [facility] Quality Team Specialist reviewed this patient medical record for the purposes of chart review for care gap closure. The following were reviewed: chart review for care gap closure-controlling blood pressure.    [facility] Quality Team

## 2023-12-05 DIAGNOSIS — N1832 Chronic kidney disease, stage 3b: Secondary | ICD-10-CM | POA: Diagnosis not present

## 2023-12-05 DIAGNOSIS — I10 Essential (primary) hypertension: Secondary | ICD-10-CM | POA: Diagnosis not present

## 2024-03-06 NOTE — Progress Notes (Signed)
 Sergio Alvarez                                          MRN: [id number]   03/06/2024   The [facility] Quality Team Specialist reviewed this patient medical record for the purposes of chart review for care gap closure. The following were reviewed: chart review for care gap closure-diabetic eye exam.    [facility] Quality Team
